# Patient Record
Sex: Female | Born: 1980 | Race: White | Hispanic: No | Marital: Married | Smoking: Never smoker
Health system: Southern US, Community
[De-identification: ages and names within clinical notes are randomized; demographics above are authoritative.]

## PROBLEM LIST (undated history)

## (undated) DIAGNOSIS — G43909 Migraine, unspecified, not intractable, without status migrainosus: Secondary | ICD-10-CM

## (undated) DIAGNOSIS — T7840XA Allergy, unspecified, initial encounter: Secondary | ICD-10-CM

## (undated) HISTORY — PX: OTHER SURGICAL HISTORY: SHX169

## (undated) HISTORY — PX: NASAL SINUS SURGERY: SHX719

## (undated) HISTORY — DX: Allergy, unspecified, initial encounter: T78.40XA

## (undated) HISTORY — DX: Migraine, unspecified, not intractable, without status migrainosus: G43.909

---

## 2018-04-22 ENCOUNTER — Other Ambulatory Visit: Payer: Self-pay

## 2018-04-22 ENCOUNTER — Encounter: Payer: Self-pay | Admitting: Family Medicine

## 2018-04-22 ENCOUNTER — Ambulatory Visit: Payer: Commercial Managed Care - PPO | Admitting: Family Medicine

## 2018-04-22 VITALS — BP 122/85 | HR 76 | Temp 98.1°F | Ht 62.0 in | Wt 167.3 lb

## 2018-04-22 DIAGNOSIS — J309 Allergic rhinitis, unspecified: Secondary | ICD-10-CM

## 2018-04-22 DIAGNOSIS — G43909 Migraine, unspecified, not intractable, without status migrainosus: Secondary | ICD-10-CM | POA: Diagnosis not present

## 2018-04-22 DIAGNOSIS — Z0000 Encounter for general adult medical examination without abnormal findings: Secondary | ICD-10-CM

## 2018-04-22 DIAGNOSIS — Z7689 Persons encountering health services in other specified circumstances: Secondary | ICD-10-CM | POA: Diagnosis not present

## 2018-04-22 DIAGNOSIS — Z23 Encounter for immunization: Secondary | ICD-10-CM

## 2018-04-22 LAB — MICROSCOPIC EXAMINATION
Bacteria, UA: NONE SEEN
WBC, UA: NONE SEEN /hpf (ref 0–5)

## 2018-04-22 LAB — UA/M W/RFLX CULTURE, ROUTINE
BILIRUBIN UA: NEGATIVE
Glucose, UA: NEGATIVE
KETONES UA: NEGATIVE
Leukocytes, UA: NEGATIVE
NITRITE UA: NEGATIVE
Protein, UA: NEGATIVE
Urobilinogen, Ur: 0.2 mg/dL (ref 0.2–1.0)
pH, UA: 5.5 (ref 5.0–7.5)

## 2018-04-22 MED ORDER — SUMATRIPTAN-NAPROXEN SODIUM 85-500 MG PO TABS: ORAL_TABLET | ORAL | 6 refills | Status: DC

## 2018-04-22 MED ORDER — CETIRIZINE HCL 10 MG PO TABS: 10.0000 mg | ORAL_TABLET | Freq: Every day | ORAL | 11 refills | Status: AC

## 2018-04-23 ENCOUNTER — Encounter: Payer: Self-pay | Admitting: Family Medicine

## 2018-04-23 LAB — COMPREHENSIVE METABOLIC PANEL
A/G RATIO: 2 (ref 1.2–2.2)
ALT: 12 IU/L (ref 0–32)
AST: 15 IU/L (ref 0–40)
Albumin: 4.2 g/dL (ref 3.5–5.5)
Alkaline Phosphatase: 57 IU/L (ref 39–117)
BUN/Creatinine Ratio: 26 — ABNORMAL HIGH (ref 9–23)
BUN: 16 mg/dL (ref 6–20)
Bilirubin Total: <0.2 mg/dL (ref 0.0–1.2)
CALCIUM: 9.8 mg/dL (ref 8.7–10.2)
CO2: 25 mmol/L (ref 20–29)
Chloride: 101 mmol/L (ref 96–106)
Creatinine, Ser: 0.61 mg/dL (ref 0.57–1.00)
GFR, EST AFRICAN AMERICAN: 132 mL/min/{1.73_m2} (ref >59)
GFR, EST NON AFRICAN AMERICAN: 115 mL/min/{1.73_m2} (ref >59)
Globulin, Total: 2.1 g/dL (ref 1.5–4.5)
Glucose: 81 mg/dL (ref 65–99)
POTASSIUM: 4.4 mmol/L (ref 3.5–5.2)
SODIUM: 141 mmol/L (ref 134–144)
TOTAL PROTEIN: 6.3 g/dL (ref 6.0–8.5)

## 2018-04-23 LAB — CBC WITH DIFFERENTIAL/PLATELET
BASOS: 0 %
Basophils Absolute: 0 10*3/uL (ref 0.0–0.2)
EOS (ABSOLUTE): 0.2 10*3/uL (ref 0.0–0.4)
EOS: 2 %
HEMATOCRIT: 40 % (ref 34.0–46.6)
Hemoglobin: 12.8 g/dL (ref 11.1–15.9)
IMMATURE GRANULOCYTES: 0 %
Immature Grans (Abs): 0 10*3/uL (ref 0.0–0.1)
Lymphocytes Absolute: 2.6 10*3/uL (ref 0.7–3.1)
Lymphs: 29 %
MCH: 29.1 pg (ref 26.6–33.0)
MCHC: 32 g/dL (ref 31.5–35.7)
MCV: 91 fL (ref 79–97)
MONOS ABS: 0.6 10*3/uL (ref 0.1–0.9)
Monocytes: 6 %
NEUTROS ABS: 5.6 10*3/uL (ref 1.4–7.0)
Neutrophils: 63 %
PLATELETS: 292 10*3/uL (ref 150–450)
RBC: 4.4 x10E6/uL (ref 3.77–5.28)
RDW: 12.3 % (ref 12.3–15.4)
WBC: 9 10*3/uL (ref 3.4–10.8)

## 2018-04-23 LAB — TSH: TSH: 1.36 u[IU]/mL (ref 0.450–4.500)

## 2018-04-23 LAB — LIPID PANEL W/O CHOL/HDL RATIO
CHOLESTEROL TOTAL: 184 mg/dL (ref 100–199)
HDL: 50 mg/dL (ref >39)
LDL CALC: 95 mg/dL (ref 0–99)
Triglycerides: 194 mg/dL — ABNORMAL HIGH (ref 0–149)
VLDL CHOLESTEROL CAL: 39 mg/dL (ref 5–40)

## 2018-04-27 ENCOUNTER — Other Ambulatory Visit (HOSPITAL_COMMUNITY)
Admission: RE | Admit: 2018-04-27 | Discharge: 2018-04-27 | Disposition: A | Discharge status: Home / Self Care | Payer: Commercial Managed Care - PPO | Source: Ambulatory Visit | Attending: Obstetrics & Gynecology | Admitting: Obstetrics & Gynecology

## 2018-04-27 ENCOUNTER — Encounter: Payer: Self-pay | Admitting: Obstetrics & Gynecology

## 2018-04-27 ENCOUNTER — Ambulatory Visit (INDEPENDENT_AMBULATORY_CARE_PROVIDER_SITE_OTHER): Payer: Commercial Managed Care - PPO | Admitting: Obstetrics & Gynecology

## 2018-04-27 VITALS — BP 120/80 | Ht 62.0 in | Wt 167.0 lb

## 2018-04-27 DIAGNOSIS — N946 Dysmenorrhea, unspecified: Secondary | ICD-10-CM | POA: Diagnosis not present

## 2018-04-27 DIAGNOSIS — N920 Excessive and frequent menstruation with regular cycle: Secondary | ICD-10-CM | POA: Diagnosis not present

## 2018-04-27 DIAGNOSIS — Z124 Encounter for screening for malignant neoplasm of cervix: Secondary | ICD-10-CM | POA: Insufficient documentation

## 2018-04-27 DIAGNOSIS — Z0000 Encounter for general adult medical examination without abnormal findings: Secondary | ICD-10-CM

## 2018-04-27 DIAGNOSIS — Z01411 Encounter for gynecological examination (general) (routine) with abnormal findings: Secondary | ICD-10-CM | POA: Diagnosis not present

## 2018-04-27 NOTE — Patient Instructions (Signed)
Endometrial Ablation Endometrial ablation is a procedure that destroys the thin inner layer of the lining of the uterus (endometrium). This procedure may be done:  To stop heavy periods.  To stop bleeding that is causing anemia.  To control irregular bleeding.  To treat bleeding caused by small tumors (fibroids) in the endometrium.  This procedure is often an alternative to major surgery, such as removal of the uterus and cervix (hysterectomy). As a result of this procedure:  You may not be able to have children. However, if you are premenopausal (you have not gone through menopause): ? You may still have a small chance of getting pregnant. ? You will need to use a reliable method of birth control after the procedure to prevent pregnancy.  You may stop having a menstrual period, or you may have only a small amount of bleeding during your period. Menstruation may return several years after the procedure.  Tell a health care provider about:  Any allergies you have.  All medicines you are taking, including vitamins, herbs, eye drops, creams, and over-the-counter medicines.  Any problems you or family members have had with the use of anesthetic medicines.  Any blood disorders you have.  Any surgeries you have had.  Any medical conditions you have. What are the risks? Generally, this is a safe procedure. However, problems may occur, including:  A hole (perforation) in the uterus or bowel.  Infection of the uterus, bladder, or vagina.  Bleeding.  Damage to other structures or organs.  An air bubble in the lung (air embolus).  Problems with pregnancy after the procedure.  Failure of the procedure.  Decreased ability to diagnose cancer in the endometrium.  What happens before the procedure?  You will have tests of your endometrium to make sure there are no pre-cancerous cells or cancer cells present.  You may have an ultrasound of the uterus.  You may be given  medicines to thin the endometrium.  Ask your health care provider about: ? Changing or stopping your regular medicines. This is especially important if you take diabetes medicines or blood thinners. ? Taking medicines such as aspirin and ibuprofen. These medicines can thin your blood. Do not take these medicines before your procedure if your doctor tells you not to.  Plan to have someone take you home from the hospital or clinic. What happens during the procedure?  You will lie on an exam table with your feet and legs supported as in a pelvic exam.  To lower your risk of infection: ? Your health care team will wash or sanitize their hands and put on germ-free (sterile) gloves. ? Your genital area will be washed with soap.  An IV tube will be inserted into one of your veins.  You will be given a medicine to help you relax (sedative).  A surgical instrument with a light and camera (resectoscope) will be inserted into your vagina and moved into your uterus. This allows your surgeon to see inside your uterus.  Endometrial tissue will be removed using one of the following methods: ? Radiofrequency. This method uses a radiofrequency-alternating electric current to remove the endometrium. ? Cryotherapy. This method uses extreme cold to freeze the endometrium. ? Heated-free liquid. This method uses a heated saltwater (saline) solution to remove the endometrium. ? Microwave. This method uses high-energy microwaves to heat up the endometrium and remove it. ? Thermal balloon. This method involves inserting a catheter with a balloon tip into the uterus. The balloon tip is   filled with heated fluid to remove the endometrium. The procedure may vary among health care providers and hospitals. What happens after the procedure?  Your blood pressure, heart rate, breathing rate, and blood oxygen level will be monitored until the medicines you were given have worn off.  As tissue healing occurs, you may  notice vaginal bleeding for 4-6 weeks after the procedure. You may also experience: ? Cramps. ? Thin, watery vaginal discharge that is light pink or brown in color. ? A need to urinate more frequently than usual. ? Nausea.  Do not drive for 24 hours if you were given a sedative.  Do not have sex or insert anything into your vagina until your health care provider approves. Summary  Endometrial ablation is done to treat the many causes of heavy menstrual bleeding.  The procedure may be done only after medications have been tried to control the bleeding.  Plan to have someone take you home from the hospital or clinic. This information is not intended to replace advice given to you by your health care provider. Make sure you discuss any questions you have with your health care provider. Document Released: 05/16/2004 Document Revised: 07/24/2016 Document Reviewed: 07/24/2016 Elsevier Interactive Patient Education  2017 [employer]  

## 2018-04-27 NOTE — Progress Notes (Signed)
HPI:      [name] is a 37 y.o. G1P1001 who LMP was Patient's last menstrual period was 04/22/2018., she presents today for her annual examination. The patient has no complaints today. The patient is sexually active. Her last pap: was normal and last mammogram: patient has never had a mammogram. The patient does perform self breast exams.  There is no notable family history of breast or ovarian cancer in her family.  The patient has regular exercise: yes.  The patient denies current symptoms of depression.  Periods are every 25-26 days, heavy for 1-2 days, painful, and has PMS for a week prior to periods; pain and bleeding lead to dysfunction for those days affected.  GYN History: Contraception: vasectomy  PMHx: Past Medical History:  Diagnosis Date  . Allergy   . Migraine    Past Surgical History:  Procedure Laterality Date  . NASAL SINUS SURGERY    . tonsils surgery     Family History  Problem Relation Age of Onset  . Diabetes Mother   . Hypertension Mother   . Asthma Mother   . Cancer Mother   . Thyroid disease Mother   . Diabetes Paternal Grandfather    Social History   Tobacco Use  . Smoking status: Never Smoker  . Smokeless tobacco: Never Used  Substance Use Topics  . Alcohol use: Not Currently  . Drug use: Never    Current Outpatient Medications:  .  cetirizine (ZYRTEC) 10 MG tablet, Take 1 tablet (10 mg total) by mouth daily., Disp: 30 tablet, Rfl: 11 .  clindamycin (CLEOCIN T) 1 % lotion, APPLY TO FACE DAILY IN THE MORNING, Disp: , Rfl: 2 .  minocycline (MINOCIN,DYNACIN) 100 MG capsule, Take by mouth., Disp: , Rfl:  .  naproxen sodium (ALEVE) 220 MG tablet, Take 220 mg by mouth as needed., Disp: , Rfl:  .  SUMAtriptan-naproxen (TREXIMET) 85-500 MG tablet, Max dose 2 tabs per day, Disp: 9 tablet, Rfl: 6 Allergies: Prochlorperazine edisylate and Topiramate  Review of Systems  Constitutional: Negative for chills, fever and malaise/fatigue.  HENT:  Negative for congestion, sinus pain and sore throat.   Eyes: Negative for blurred vision and pain.  Respiratory: Negative for cough and wheezing.   Cardiovascular: Negative for chest pain and leg swelling.  Gastrointestinal: Negative for abdominal pain, constipation, diarrhea, heartburn, nausea and vomiting.  Genitourinary: Negative for dysuria, frequency, hematuria and urgency.  Musculoskeletal: Negative for back pain, joint pain, myalgias and neck pain.  Skin: Negative for itching and rash.  Neurological: Negative for dizziness, tremors and weakness.  Endo/Heme/Allergies: Does not bruise/bleed easily.  Psychiatric/Behavioral: Negative for depression. The patient is not nervous/anxious and does not have insomnia.    Objective: BP 120/80   Ht 5\' 2"  (1.575 m)   Wt 167 lb (75.8 kg)   LMP 04/22/2018   BMI 30.54 kg/m   Filed Weights   04/27/18 1449  Weight: 167 lb (75.8 kg)   Body mass index is 30.54 kg/m. Physical Exam  Constitutional: She is oriented to person, place, and time. She appears well-developed and well-nourished. No distress.  Genitourinary: Rectum normal, vagina normal and uterus normal. Pelvic exam was performed with patient supine. There is no rash or lesion on the right labia. There is no rash or lesion on the left labia. Vagina exhibits no lesion. No bleeding in the vagina. Right adnexum does not display mass and does not display tenderness. Left adnexum does not display mass and does not display  tenderness. Cervix does not exhibit motion tenderness, lesion, friability or polyp.   Uterus is mobile and midaxial. Uterus is not enlarged or exhibiting a mass.  HENT:  Head: Normocephalic and atraumatic. Head is without laceration.  Right Ear: Hearing normal.  Left Ear: Hearing normal.  Nose: No epistaxis.  No foreign bodies.  Mouth/Throat: Uvula is midline, oropharynx is clear and moist and mucous membranes are normal.  Eyes: Pupils are equal, round, and reactive to light.   Neck: Normal range of motion. Neck supple. No thyromegaly present.  Cardiovascular: Normal rate and regular rhythm. Exam reveals no gallop and no friction rub.  No murmur heard. Pulmonary/Chest: Effort normal and breath sounds normal. No respiratory distress. She has no wheezes. Right breast exhibits no mass, no skin change and no tenderness. Left breast exhibits no mass, no skin change and no tenderness.  Abdominal: Soft. Bowel sounds are normal. She exhibits no distension. There is no tenderness. There is no rebound.  Musculoskeletal: Normal range of motion.  Neurological: She is alert and oriented to person, place, and time. No cranial nerve deficit.  Skin: Skin is warm and dry.  Psychiatric: She has a normal mood and affect. Judgment normal.  Vitals reviewed.  Assessment:  ANNUAL EXAM 1. Annual physical exam   2. Screening for cervical cancer   3. Dysmenorrhea   4. Menorrhagia with regular cycle    Screening Plan:            1.  Cervical Screening-  Pap smear done today  2. Breast screening- Exam annually and mammogram>40 planned   3. Labs managed by PCP  4. Counseling for contraception: vasectomy   5. Dysmenorrhea and heavy bleeding for 1-2 days per cycle that lays her up in bed and interferes w work/daily activities.  Considering options of ablation.    F/U  Return in about 1 year (around 04/28/2019) for Annual.  Sabrina Major, MD, [facility], [facility] 04/27/2018  3:08 PM

## 2018-04-29 LAB — CYTOLOGY - PAP: Diagnosis: NEGATIVE

## 2018-05-16 DIAGNOSIS — J309 Allergic rhinitis, unspecified: Secondary | ICD-10-CM | POA: Insufficient documentation

## 2018-05-16 DIAGNOSIS — G43909 Migraine, unspecified, not intractable, without status migrainosus: Secondary | ICD-10-CM | POA: Insufficient documentation

## 2018-05-16 NOTE — Patient Instructions (Signed)
Follow up in 1 year.

## 2018-05-16 NOTE — Assessment & Plan Note (Signed)
Stable on zyrtec, recommended flonase if becoming symptomatic

## 2018-05-16 NOTE — Assessment & Plan Note (Signed)
Well controlled with treximet, continue current regimen

## 2018-05-16 NOTE — Progress Notes (Signed)
BP 122/85   Pulse 76   Temp 98.1 F (36.7 C) (Oral)   Ht 5\' 2"  (1.575 m)   Wt 167 lb 4.8 oz (75.9 kg)   LMP 04/22/2018   BMI 30.60 kg/m    Subjective:    Patient ID: Sabrina Kennedy, female    DOB: [birth year], 37 y.o.   MRN: [id number]  HPI: Sabrina Kennedy is a 37 y.o. female presenting on 04/22/2018 for comprehensive medical examination. Current medical complaints include:see below  Here today to establish care.   Hx of migraines. Averages 2-3 migraines monthly, good relief with imitrex and NSAIDs. Denies N/V, blurry vision, confusion, syncope.   Has appt for a pap smear next week with GYN.   Knows she's had a tdap within the last 2 years just does not remember exact date.   Chronic neck and low back pain, managed by a chiropractor. Takes naproxen prn.   She currently lives with: Menopausal Symptoms: no  Depression Screen done today and results listed below:  Depression screen [facility] 2/9 04/22/2018  Decreased Interest 0  Down, Depressed, Hopeless 0  PHQ - 2 Score 0  Altered sleeping 0  Tired, decreased energy 0  Change in appetite 0  Feeling bad or failure about yourself  0  Trouble concentrating 0  Moving slowly or fidgety/restless 0  PHQ-9 Score 0    The patient does not have a history of falls. I did not complete a risk assessment for falls. A plan of care for falls was not documented.   Past Medical History:  Past Medical History:  Diagnosis Date  . Allergy   . Migraine     Surgical History:  Past Surgical History:  Procedure Laterality Date  . NASAL SINUS SURGERY    . tonsils surgery      Medications:  Current Outpatient Medications on File Prior to Visit  Medication Sig  . clindamycin (CLEOCIN T) 1 % lotion APPLY TO FACE DAILY IN THE MORNING  . minocycline (MINOCIN,DYNACIN) 100 MG capsule Take by mouth.  . naproxen sodium (ALEVE) 220 MG tablet Take 220 mg by mouth as needed.   No current facility-administered medications on file prior to visit.      Allergies:  Allergies  Allergen Reactions  . Prochlorperazine Edisylate   . Topiramate Other (See Comments)    Severe abdominal pain, rash on trunk, 1 day had stool incontinance    Social History:  Social History   Socioeconomic History  . Marital status: Married    Spouse name: Not on file  . Number of children: Not on file  . Years of education: Not on file  . Highest education level: Not on file  Occupational History  . Not on file  Social Needs  . Financial resource strain: Not on file  . Food insecurity:    Worry: Not on file    Inability: Not on file  . Transportation needs:    Medical: Not on file    Non-medical: Not on file  Tobacco Use  . Smoking status: Never Smoker  . Smokeless tobacco: Never Used  Substance and Sexual Activity  . Alcohol use: Not Currently  . Drug use: Never  . Sexual activity: Yes  Lifestyle  . Physical activity:    Days per week: Not on file    Minutes per session: Not on file  . Stress: Not on file  Relationships  . Social connections:    Talks on phone: Not on file    Gets  together: Not on file    Attends religious service: Not on file    Active member of club or organization: Not on file    Attends meetings of clubs or organizations: Not on file    Relationship status: Not on file  . Intimate partner violence:    Fear of current or ex partner: Not on file    Emotionally abused: Not on file    Physically abused: Not on file    Forced sexual activity: Not on file  Other Topics Concern  . Not on file  Social History Narrative  . Not on file   Social History   Tobacco Use  Smoking Status Never Smoker  Smokeless Tobacco Never Used   Social History   Substance and Sexual Activity  Alcohol Use Not Currently    Family History:  Family History  Problem Relation Age of Onset  . Diabetes Mother   . Hypertension Mother   . Asthma Mother   . Cancer Mother   . Thyroid disease Mother   . Diabetes Paternal  Grandfather     Past medical history, surgical history, medications, allergies, family history and social history reviewed with patient today and changes made to appropriate areas of the chart.   Review of Systems - General ROS: negative Psychological ROS: negative Ophthalmic ROS: negative ENT ROS: negative Allergy and Immunology ROS: negative Hematological and Lymphatic ROS: negative Endocrine ROS: negative Breast ROS: negative for breast lumps Respiratory ROS: no cough, shortness of breath, or wheezing Cardiovascular ROS: no chest pain or dyspnea on exertion Gastrointestinal ROS: no abdominal pain, change in bowel habits, or black or bloody stools Genito-Urinary ROS: no dysuria, trouble voiding, or hematuria Musculoskeletal ROS: negative Neurological ROS: no TIA or stroke symptoms Dermatological ROS: negative All other ROS negative except what is listed above and in the HPI.      Objective:    BP 122/85   Pulse 76   Temp 98.1 F (36.7 C) (Oral)   Ht 5\' 2"  (1.575 m)   Wt 167 lb 4.8 oz (75.9 kg)   LMP 04/22/2018   BMI 30.60 kg/m   Wt Readings from Last 3 Encounters:  04/27/18 167 lb (75.8 kg)  04/22/18 167 lb 4.8 oz (75.9 kg)    Physical Exam  Constitutional: She is oriented to person, place, and time. She appears well-developed and well-nourished. No distress.  HENT:  Head: Atraumatic.  Right Ear: External ear normal.  Left Ear: External ear normal.  Nose: Nose normal.  Mouth/Throat: Oropharynx is clear and moist. No oropharyngeal exudate.  Eyes: Pupils are equal, round, and reactive to light. Conjunctivae are normal. No scleral icterus.  Neck: Normal range of motion. Neck supple. No thyromegaly present.  Cardiovascular: Normal rate, regular rhythm, normal heart sounds and intact distal pulses.  Pulmonary/Chest: Effort normal and breath sounds normal. No respiratory distress.  Breast exam done through GYN  Abdominal: Soft. Bowel sounds are normal. She exhibits no  mass. There is no tenderness.  Genitourinary:  Genitourinary Comments: Pelvic exam done through GYN  Musculoskeletal: Normal range of motion. She exhibits no edema or tenderness.  Lymphadenopathy:    She has no cervical adenopathy.  Neurological: She is alert and oriented to person, place, and time. No cranial nerve deficit.  Skin: Skin is warm and dry. No rash noted.  Psychiatric: She has a normal mood and affect. Her behavior is normal.  Nursing note and vitals reviewed.   Results for orders placed or performed in visit on  04/22/18  Microscopic Examination  Result Value Ref Range   WBC, UA None seen 0 - 5 /hpf   RBC, UA 0-2 0 - 2 /hpf   Epithelial Cells (non renal) 0-10 0 - 10 /hpf   Bacteria, UA None seen None seen/Few  CBC with Differential/Platelet  Result Value Ref Range   WBC 9.0 3.4 - 10.8 x10E3/uL   RBC 4.40 3.77 - 5.28 x10E6/uL   Hemoglobin 12.8 11.1 - 15.9 g/dL   Hematocrit 82.9 56.2 - 46.6 %   MCV 91 79 - 97 fL   MCH 29.1 26.6 - 33.0 pg   MCHC 32.0 31.5 - 35.7 g/dL   RDW 13.0 86.5 - 78.4 %   Platelets 292 150 - 450 x10E3/uL   Neutrophils 63 Not Estab. %   Lymphs 29 Not Estab. %   Monocytes 6 Not Estab. %   Eos 2 Not Estab. %   Basos 0 Not Estab. %   Neutrophils Absolute 5.6 1.4 - 7.0 x10E3/uL   Lymphocytes Absolute 2.6 0.7 - 3.1 x10E3/uL   Monocytes Absolute 0.6 0.1 - 0.9 x10E3/uL   EOS (ABSOLUTE) 0.2 0.0 - 0.4 x10E3/uL   Basophils Absolute 0.0 0.0 - 0.2 x10E3/uL   Immature Granulocytes 0 Not Estab. %   Immature Grans (Abs) 0.0 0.0 - 0.1 x10E3/uL  Comprehensive metabolic panel  Result Value Ref Range   Glucose 81 65 - 99 mg/dL   BUN 16 6 - 20 mg/dL   Creatinine, Ser 6.96 0.57 - 1.00 mg/dL   GFR calc non Af Amer 115 >59 mL/min/1.73   GFR calc Af Amer 132 >59 mL/min/1.73   BUN/Creatinine Ratio 26 (H) 9 - 23   Sodium 141 134 - 144 mmol/L   Potassium 4.4 3.5 - 5.2 mmol/L   Chloride 101 96 - 106 mmol/L   CO2 25 20 - 29 mmol/L   Calcium 9.8 8.7 - 10.2 mg/dL    Total Protein 6.3 6.0 - 8.5 g/dL   Albumin 4.2 3.5 - 5.5 g/dL   Globulin, Total 2.1 1.5 - 4.5 g/dL   Albumin/Globulin Ratio 2.0 1.2 - 2.2   Bilirubin Total <0.2 0.0 - 1.2 mg/dL   Alkaline Phosphatase 57 39 - 117 IU/L   AST 15 0 - 40 IU/L   ALT 12 0 - 32 IU/L  Lipid Panel w/o Chol/HDL Ratio  Result Value Ref Range   Cholesterol, Total 184 100 - 199 mg/dL   Triglycerides 295 (H) 0 - 149 mg/dL   HDL 50 >28 mg/dL   VLDL Cholesterol Cal 39 5 - 40 mg/dL   LDL Calculated 95 0 - 99 mg/dL  TSH  Result Value Ref Range   TSH 1.360 0.450 - 4.500 uIU/mL  UA/M w/rflx Culture, Routine  Result Value Ref Range   Specific Gravity, UA <1.005 (L) 1.005 - 1.030   pH, UA 5.5 5.0 - 7.5   Color, UA Yellow Yellow   Appearance Ur Clear Clear   Leukocytes, UA Negative Negative   Protein, UA Negative Negative/Trace   Glucose, UA Negative Negative   Ketones, UA Negative Negative   RBC, UA 1+ (A) Negative   Bilirubin, UA Negative Negative   Urobilinogen, Ur 0.2 0.2 - 1.0 mg/dL   Nitrite, UA Negative Negative   Microscopic Examination See below:       Assessment & Plan:   Problem List Items Addressed This Visit      Cardiovascular and Mediastinum   Migraine    Well controlled with treximet, continue  current regimen      Relevant Medications   naproxen sodium (ALEVE) 220 MG tablet   SUMAtriptan-naproxen (TREXIMET) 85-500 MG tablet     Respiratory   Allergic rhinitis - Primary    Stable on zyrtec, recommended flonase if becoming symptomatic       Other Visit Diagnoses    Encounter to establish care       Annual physical exam       Relevant Orders   CBC with Differential/Platelet (Completed)   Comprehensive metabolic panel (Completed)   Lipid Panel w/o Chol/HDL Ratio (Completed)   TSH (Completed)   UA/M w/rflx Culture, Routine (Completed)   Flu vaccine need       Relevant Orders   Flu Vaccine QUAD 36+ mos IM (Completed)       Follow up plan: Return in about 1 year (around  04/23/2019) for CPE.   LABORATORY TESTING:  - Pap smear: done elsewhere  IMMUNIZATIONS:   - Tdap: Tetanus vaccination status reviewed: last tetanus booster within 10 years. - Influenza: Administered today  PATIENT COUNSELING:   Advised to take 1 mg of folate supplement per day if capable of pregnancy.   Sexuality: Discussed sexually transmitted diseases, partner selection, use of condoms, avoidance of unintended pregnancy  and contraceptive alternatives.   Advised to avoid cigarette smoking.  I discussed with the patient that most people either abstain from alcohol or drink within safe limits (<=14/week and <=4 drinks/occasion for males, <=7/weeks and <= 3 drinks/occasion for females) and that the risk for alcohol disorders and other health effects rises proportionally with the number of drinks per week and how often a drinker exceeds daily limits.  Discussed cessation/primary prevention of drug use and availability of treatment for abuse.   Diet: Encouraged to adjust caloric intake to maintain  or achieve ideal body weight, to reduce intake of dietary saturated fat and total fat, to limit sodium intake by avoiding high sodium foods and not adding table salt, and to maintain adequate dietary potassium and calcium preferably from fresh fruits, vegetables, and low-fat dairy products.    stressed the importance of regular exercise  Injury prevention: Discussed safety belts, safety helmets, smoke detector, smoking near bedding or upholstery.   Dental health: Discussed importance of regular tooth brushing, flossing, and dental visits.    NEXT PREVENTATIVE PHYSICAL DUE IN 1 YEAR. Return in about 1 year (around 04/23/2019) for CPE.

## 2018-08-13 ENCOUNTER — Telehealth: Payer: Self-pay

## 2018-08-13 DIAGNOSIS — N920 Excessive and frequent menstruation with regular cycle: Secondary | ICD-10-CM

## 2018-08-13 NOTE — Telephone Encounter (Signed)
Pt wishes to move forward c ablation. Knows for sure she wants it done in March. Does she needs to come in for consultation or something and if so, when?  [phone]

## 2018-08-16 NOTE — Telephone Encounter (Signed)
Can you put the order in for the u/s

## 2018-08-16 NOTE — Telephone Encounter (Signed)
Yes, schedule appt for uterine assessment prior to ablation (ultrasound, biopsy (this is minor)).  [facility] [place] and appt w me (PH) therafter.  Then we can schedule ablation after period in March.

## 2018-08-17 NOTE — Telephone Encounter (Signed)
Patient is schedule 08/24/18 with RPH °

## 2018-08-24 ENCOUNTER — Encounter: Payer: Self-pay | Admitting: Obstetrics & Gynecology

## 2018-08-24 ENCOUNTER — Other Ambulatory Visit (HOSPITAL_COMMUNITY)
Admission: RE | Admit: 2018-08-24 | Discharge: 2018-08-24 | Disposition: A | Discharge status: Home / Self Care | Payer: Commercial Managed Care - PPO | Source: Ambulatory Visit | Attending: Obstetrics & Gynecology | Admitting: Obstetrics & Gynecology

## 2018-08-24 ENCOUNTER — Ambulatory Visit (INDEPENDENT_AMBULATORY_CARE_PROVIDER_SITE_OTHER): Payer: Commercial Managed Care - PPO | Admitting: Obstetrics & Gynecology

## 2018-08-24 ENCOUNTER — Ambulatory Visit (INDEPENDENT_AMBULATORY_CARE_PROVIDER_SITE_OTHER): Payer: Commercial Managed Care - PPO

## 2018-08-24 VITALS — BP 140/90 | Ht 62.0 in | Wt 164.0 lb

## 2018-08-24 DIAGNOSIS — N920 Excessive and frequent menstruation with regular cycle: Secondary | ICD-10-CM | POA: Diagnosis not present

## 2018-08-24 DIAGNOSIS — N946 Dysmenorrhea, unspecified: Secondary | ICD-10-CM | POA: Diagnosis not present

## 2018-08-24 NOTE — Patient Instructions (Signed)
Endometrial Ablation Endometrial ablation is a procedure that destroys the thin inner layer of the lining of the uterus (endometrium). This procedure may be done:  To stop heavy periods.  To stop bleeding that is causing anemia.  To control irregular bleeding.  To treat bleeding caused by small tumors (fibroids) in the endometrium. This procedure is often an alternative to major surgery, such as removal of the uterus and cervix (hysterectomy). As a result of this procedure:  You may not be able to have children. However, if you are premenopausal (you have not gone through menopause): ? You may still have a small chance of getting pregnant. ? You will need to use a reliable method of birth control after the procedure to prevent pregnancy.  You may stop having a menstrual period, or you may have only a small amount of bleeding during your period. Menstruation may return several years after the procedure. Tell a health care provider about:  Any allergies you have.  All medicines you are taking, including vitamins, herbs, eye drops, creams, and over-the-counter medicines.  Any problems you or family members have had with the use of anesthetic medicines.  Any blood disorders you have.  Any surgeries you have had.  Any medical conditions you have. What are the risks? Generally, this is a safe procedure. However, problems may occur, including:  A hole (perforation) in the uterus or bowel.  Infection of the uterus, bladder, or vagina.  Bleeding.  Damage to other structures or organs.  An air bubble in the lung (air embolus).  Problems with pregnancy after the procedure.  Failure of the procedure.  Decreased ability to diagnose cancer in the endometrium. What happens before the procedure?  You will have tests of your endometrium to make sure there are no pre-cancerous cells or cancer cells present.  You may have an ultrasound of the uterus.  You may be given medicines to  thin the endometrium.  Ask your health care provider about: ? Changing or stopping your regular medicines. This is especially important if you take diabetes medicines or blood thinners. ? Taking medicines such as aspirin and ibuprofen. These medicines can thin your blood. Do not take these medicines before your procedure if your doctor tells you not to.  Plan to have someone take you home from the hospital or clinic. What happens during the procedure?   You will lie on an exam table with your feet and legs supported as in a pelvic exam.  To lower your risk of infection: ? Your health care team will wash or sanitize their hands and put on germ-free (sterile) gloves. ? Your genital area will be washed with soap.  An IV tube will be inserted into one of your veins.  You will be given a medicine to help you relax (sedative).  A surgical instrument with a light and camera (resectoscope) will be inserted into your vagina and moved into your uterus. This allows your surgeon to see inside your uterus.  Endometrial tissue will be removed using one of the following methods: ? Radiofrequency. This method uses a radiofrequency-alternating electric current to remove the endometrium. ? Cryotherapy. This method uses extreme cold to freeze the endometrium. ? Heated-free liquid. This method uses a heated saltwater (saline) solution to remove the endometrium. ? Microwave. This method uses high-energy microwaves to heat up the endometrium and remove it. ? Thermal balloon. This method involves inserting a catheter with a balloon tip into the uterus. The balloon tip is filled with   heated fluid to remove the endometrium. The procedure may vary among health care providers and hospitals. What happens after the procedure?  Your blood pressure, heart rate, breathing rate, and blood oxygen level will be monitored until the medicines you were given have worn off.  As tissue healing occurs, you may notice  vaginal bleeding for 4-6 weeks after the procedure. You may also experience: ? Cramps. ? Thin, watery vaginal discharge that is light pink or brown in color. ? A need to urinate more frequently than usual. ? Nausea.  Do not drive for 24 hours if you were given a sedative.  Do not have sex or insert anything into your vagina until your health care provider approves. Summary  Endometrial ablation is done to treat the many causes of heavy menstrual bleeding.  The procedure may be done only after medications have been tried to control the bleeding.  Plan to have someone take you home from the hospital or clinic. This information is not intended to replace advice given to you by your health care provider. Make sure you discuss any questions you have with your health care provider. Document Released: 05/16/2004 Document Revised: 12/22/2017 Document Reviewed: 07/24/2016 Elsevier Interactive Patient Education  2019 Elsevier Inc.   Endometrial Biopsy, Care After This sheet gives you information about how to care for yourself after your procedure. Your health care provider may also give you more specific instructions. If you have problems or questions, contact your health care provider. What can I expect after the procedure? After the procedure, it is common to have:  Mild cramping.  A small amount of vaginal bleeding for a few days. This is normal. Follow these instructions at home:   Take over-the-counter and prescription medicines only as told by your health care provider.  Do not douche, use tampons, or have sexual intercourse until your health care provider approves.  Return to your normal activities as told by your health care provider. Ask your health care provider what activities are safe for you.  Follow instructions from your health care provider about any activity restrictions, such as restrictions on strenuous exercise or heavy lifting. Contact a health care provider  if:  You have heavy bleeding, or bleed for longer than 2 days after the procedure.  You have bad smelling discharge from your vagina.  You have a fever or chills.  You have a burning sensation when urinating or you have difficulty urinating.  You have severe pain in your lower abdomen. Get help right away if:  You have severe cramps in your stomach or back.  You pass large blood clots.  Your bleeding increases.  You become weak or light-headed, or you pass out. Summary  After the procedure, it is common to have mild cramping and a small amount of vaginal bleeding for a few days.  Do not douche, use tampons, or have sexual intercourse until your health care provider approves.  Return to your normal activities as told by your health care provider. Ask your health care provider what activities are safe for you. This information is not intended to replace advice given to you by your health care provider. Make sure you discuss any questions you have with your health care provider. Document Released: 04/27/2013 Document Revised: 07/23/2016 Document Reviewed: 07/23/2016 Elsevier Interactive Patient Education  2019 [employer].

## 2018-08-24 NOTE — Progress Notes (Signed)
  HPI: Dysfunctional Uterine Bleeding Patient complains of irregular menses. She had been bleeding regularly. She is now bleeding every 22-25 days and menses are lasting 5-7 days. She changes her pad or tampon every 1 hours. Clots are medium in size. Dysmenorrhea:moderate, occurring throughout menses. Cyclic symptoms include: bloating, breast tenderness, changes in libido, depression and headache. Current contraception: vasectomy. History of infertility: no. History of abnormal Pap smear: no.   Ultrasound demonstrates no masses seen These findings are normal  PMHx: She  has a past medical history of Allergy and Migraine. Also,  has a past surgical history that includes tonsils surgery and Nasal sinus surgery., family history includes Asthma in her mother; Cancer in her mother; Diabetes in her mother and paternal grandfather; Hypertension in her mother; Thyroid disease in her mother.,  reports that she has never smoked. She has never used smokeless tobacco. She reports previous alcohol use. She reports that she does not use drugs.  She has a current medication list which includes the following prescription(s): cetirizine, clindamycin, minocycline, naproxen sodium, and sumatriptan-naproxen. Also, is allergic to prochlorperazine edisylate and topiramate.  Review of Systems  All other systems reviewed and are negative.  Objective: BP 140/90   Ht 5\' 2"  (1.575 m)   Wt 164 lb (74.4 kg)   LMP 07/31/2018   BMI 30.00 kg/m   Physical examination Constitutional NAD, Conversant  Skin No rashes, lesions or ulceration.   Extremities: Moves all appropriately.  Normal ROM for age. No lymphadenopathy.  Neuro: Grossly intact  Psych: Oriented to PPT.  Normal mood. Normal affect.   Assessment:  Menorrhagia with regular cycle Dysmenorrhea Patient has abnormal uterine bleeding . She has a normal exam today, with no evidence of lesions.  Evaluation includes the following: exam, labs such as hormonal  testing, and pelvic ultrasound to evaluate for any structural gynecologic abnormalities.  Patient to follow up after testing.  Treatment option for menorrhagia or menometrorrhagia discussed in great detail with the patient.  Options include hormonal therapy, IUD therapy such as Mirena, D&C, Ablation, and Hysterectomy.  The pros and cons of each option discussed with patient.  Desires ablation. Plan for Oct 01, 2018 Info provided  Endometrial Biopsy After discussion with the patient regarding her abnormal uterine bleeding I recommended that she proceed with an endometrial biopsy for further diagnosis. The risks, benefits, alternatives, and indications for an endometrial biopsy were discussed with the patient in detail. She understood the risks including infection, bleeding, cervical laceration and uterine perforation.  Verbal consent was obtained.   PROCEDURE NOTE:  Pipelle endometrial biopsy was performed using aseptic technique with iodine preparation.  The uterus was sounded to a length of 7 cm.  Adequate sampling was obtained with minimal blood loss.  The patient tolerated the procedure well.  Disposition will be pending pathology.  Annamarie Major, MD, [facility], [facility] 08/24/2018  3:36 PM

## 2018-08-25 ENCOUNTER — Telehealth: Payer: Self-pay | Admitting: Obstetrics & Gynecology

## 2018-08-25 NOTE — Telephone Encounter (Signed)
Per [facility], $40 copay only, will not apply deductible and coinsurance, no auth required. Ref# [id number].

## 2018-08-25 NOTE — Telephone Encounter (Signed)
Patient is aware of H&P for in-office procedure on Monday, 09/27/18 @ 4:10p.m, and HYSTEROSCOPY WITH ABLATION on Friday, 10/01/18 @ 1:30p.m. w/ Dr. Tiburcio Pea. Patient confirmed UMR and no secondary insurance. UMR benefits discussed. Patient said she called for benefits and was told she would only owe a copay for the procedure.

## 2018-08-25 NOTE — Telephone Encounter (Signed)
-----   Message from Nadara Mustard, MD sent at 08/24/2018  3:48 PM EST ----- Regarding: OFFICE ABLATION Surgery Booking Request Patient Full Name:  Sabrina Kennedy  MRN: [id number]  DOB: Dec 19, 1980  Surgeon: Letitia Libra, MD  Requested Surgery Date and Time: 10/01/2018 in the afternoon Primary Diagnosis AND Code: Menorrhagia and dysmenorrhea Secondary Diagnosis and Code:  Surgical Procedure: Hysteroscopy with Ablation (IN OFFICE) L&D Notification: No Admission Status: IN OFFICE Length of Surgery: 30 min Special Case Needs: Minerva H&P: yes (date)

## 2018-08-27 NOTE — Progress Notes (Signed)
Pt aware.

## 2018-08-27 NOTE — Progress Notes (Signed)
Let her know biopsy as expected was normal

## 2018-09-27 ENCOUNTER — Encounter: Payer: Self-pay | Admitting: Obstetrics & Gynecology

## 2018-09-27 ENCOUNTER — Ambulatory Visit (INDEPENDENT_AMBULATORY_CARE_PROVIDER_SITE_OTHER): Payer: Commercial Managed Care - PPO | Admitting: Obstetrics & Gynecology

## 2018-09-27 VITALS — BP 108/80 | Ht 62.0 in | Wt 162.0 lb

## 2018-09-27 DIAGNOSIS — N920 Excessive and frequent menstruation with regular cycle: Secondary | ICD-10-CM

## 2018-09-27 DIAGNOSIS — N946 Dysmenorrhea, unspecified: Secondary | ICD-10-CM

## 2018-09-27 NOTE — Progress Notes (Signed)
   PRE-PROCEDURE HISTORY AND PHYSICAL EXAM  HPI:  Sabrina Kennedy is a 38 y.o. G1P1001 Patient's last menstrual period was 09/20/2018.; she is being admitted for procedure related to abnormal uterine bleeding.  Heavy more frequent and irreg periods w associated sx's.  Normal [place] and EMB and PAP.  PMHx: Past Medical History:  Diagnosis Date  . Allergy   . Migraine    Past Surgical History:  Procedure Laterality Date  . NASAL SINUS SURGERY    . tonsils surgery     Family History  Problem Relation Age of Onset  . Diabetes Mother   . Hypertension Mother   . Asthma Mother   . Cancer Mother   . Thyroid disease Mother   . Diabetes Paternal Grandfather    Social History   Tobacco Use  . Smoking status: Never Smoker  . Smokeless tobacco: Never Used  Substance Use Topics  . Alcohol use: Not Currently  . Drug use: Never    Current Outpatient Medications:  .  cetirizine (ZYRTEC) 10 MG tablet, Take 1 tablet (10 mg total) by mouth daily., Disp: 30 tablet, Rfl: 11 .  naproxen sodium (ALEVE) 220 MG tablet, Take 220 mg by mouth as needed., Disp: , Rfl:  .  SUMAtriptan-naproxen (TREXIMET) 85-500 MG tablet, Max dose 2 tabs per day, Disp: 9 tablet, Rfl: 6 .  clindamycin (CLEOCIN T) 1 % lotion, APPLY TO FACE DAILY IN THE MORNING, Disp: , Rfl: 2 .  minocycline (MINOCIN,DYNACIN) 100 MG capsule, Take by mouth., Disp: , Rfl:  Allergies: Prochlorperazine edisylate and Topiramate  Review of Systems  All other systems reviewed and are negative.  Objective: BP 108/80   Ht 5\' 2"  (1.575 m)   Wt 162 lb (73.5 kg)   LMP 09/20/2018   BMI 29.63 kg/m   Filed Weights   09/27/18 1615  Weight: 162 lb (73.5 kg)   Physical Exam Constitutional:      General: She is not in acute distress.    Appearance: She is well-developed.  Musculoskeletal: Normal range of motion.  Neurological:     Mental Status: She is alert and oriented to person, place, and time.  Skin:    General: Skin is warm and dry.    Vitals signs reviewed.   Assessment: 1. Menorrhagia with regular cycle   2. Dysmenorrhea   Option for ablation discussed; alternatives counseled  Patient was told that it is normal to have menstrual bleeding after an endometrial ablation, only about 80% of patients become amenorrheic, 10% of patients have normal or light periods, and 10% of patients have no change in their bleeding pattern and may need further intervention.  She was told she will observe her periods for a few months after her ablation to see what her periods will be like; it is recommended to wait until at least three months after the procedure before making conclusions about how periods are going to be like after an ablation.  Medications discussed for before and after the procedure. Rx given.  Annamarie Major, MD, [facility], [facility] 09/27/2018  4:32 PM

## 2018-09-27 NOTE — Patient Instructions (Signed)
  Endometrial Ablation Pre-Procedural Instructions for Patient   You may have a light meal prior to coming to the office if desired.    You can plan on your appointment taking about 1 hour.  The actual procedure lasts for 1/2 hour but you will need to remain in the office for a short period after the procedure.    You may feel drowsy from the medication administered prior to and during the procedure. You should arrange for transportation to and from the office.    The following medications have been prescribed to you.  Please follow the doctor's instructions in taking these medications:  Day before Procedure    Cytotec 200 mg vaginally at bedtime    Ibuprofen 800 mg one by mouth three times a day for 2 days before procedure    Day of Procedure  Phenergan 25 mg  by mouth 1 hour before procedure Valium 5mg   1 by mouth 1 hour before procedure Norco 5/325 mg 1 by mouth 1 hour before procedure    then1 to 2 by mouth every 4-5 hours as needed for pain Ibuprofen 600 mg 1 by mouth every 8 hours as needed for pain

## 2018-10-01 ENCOUNTER — Encounter: Payer: Self-pay | Admitting: Obstetrics & Gynecology

## 2018-10-01 ENCOUNTER — Other Ambulatory Visit: Payer: Self-pay

## 2018-10-01 ENCOUNTER — Ambulatory Visit (INDEPENDENT_AMBULATORY_CARE_PROVIDER_SITE_OTHER): Payer: Commercial Managed Care - PPO | Admitting: Obstetrics & Gynecology

## 2018-10-01 VITALS — BP 120/80 | Ht 62.0 in | Wt 166.0 lb

## 2018-10-01 DIAGNOSIS — N920 Excessive and frequent menstruation with regular cycle: Secondary | ICD-10-CM | POA: Diagnosis not present

## 2018-10-01 DIAGNOSIS — N946 Dysmenorrhea, unspecified: Secondary | ICD-10-CM | POA: Diagnosis not present

## 2018-10-01 NOTE — Progress Notes (Signed)
Hysteroscopy with Minerva Endometrial Ablation  Operative Report   Place of Surgery: [facility] Pre-Operative Diagnosis: Menorrhagia Procedure: Hysteroscopy with Minerva Endometrial Ablation  Surgeon: Annamarie Major, MD  Anesthesia: Paracervical Block   Operative Technique:  The patient was prepped and draped in the usual manner for Hysteroscopy with Minerva. Patient had voided prior to coming to the OR. A short weighted speculum was placed into the vagina and the anterior lip of the cervix was grasped with a single tooth tenaculum. Deep intra-cervical block was placed with a mixture of Marcaine 0.5% (6 cc's) and Lidocaine 1% (6 cc's) by placing 3 cc's into each quadrant with a spinal needle. The endocervical canal was measured to 3.5 centimeters. The uterus was then sounded to 8 centimeters. The cervix was dilated to 8 mm with Hegar dilators. Hysteroscopy was then carried out using saline as the distention media.  Hysteroscopy revealed:  A normal endometrial cavity without perforation or myometrial damage. Both tubal ostia were visualized.  One small polyp<1cm.   It was decided to proceed with the Minerva ablation. The device was set for the appropriate cavity length and was placed into the uterus and deployed. The width was noted on the device to be in the approved Green [place]. The cervical balloon was inflated and the cervical seal was confirmed by the Minerva Multiplex Controller. The foot pedal was then pressed and the 2-part Uterine Integrity Test (UIT) confirmed the integrity of the cavity. Upon completion of the UIT, the treatment cycle lasted 120 seconds. The device was unlocked and removed.   Repeat hysteroscopy revealed that all visual areas of active endometrium had been adequately treated.  Polyp also seems to have been ablated and gone.   At this point the procedure is complete and the patient was awakened and in stable condition.   She will be allowed to return to her home, provided that  she is doing well. She will be instructed to call to report any increased bleeding, uncontrolled pain, fever, SOB, or any other significant change in her condition. She is given a postoperative instruction sheet and a follow up appointment for 2 weeks.   Annamarie Major, MD, [facility], [facility] 10/01/2018  1:39 PM

## 2018-10-01 NOTE — Patient Instructions (Signed)
    Endometrial Ablation Post-Procedural Instructions for Patient   You may experience mild to moderate cramping (like menstrual cramping) and pinkish watery discharge.  This may last approximately 2 to 3 weeks. Use pads, not tampons during this time.  No sexual activity for 3 weeks post procedure.  Follow up medications and directions for taking the medications are    NORCO 5/500 1 or 2 by mouth every 4 to 6 hours as needed IBUPROFEN 800 mg by mouth three times a day for the next two days PHENERGAN 25 mg by mouth q 6 hours for nausea       Call our office if you develop any of the following: ? Fever of 100.4 or greater  ? Worsening pelvic pain  ? Nausea  ? Vomiting  ? Greenish vaginal discharge with odor   Office phone number: [phone]   

## 2018-10-15 ENCOUNTER — Ambulatory Visit (INDEPENDENT_AMBULATORY_CARE_PROVIDER_SITE_OTHER): Payer: Commercial Managed Care - PPO | Admitting: Obstetrics & Gynecology

## 2018-10-15 ENCOUNTER — Other Ambulatory Visit: Payer: Self-pay

## 2018-10-15 ENCOUNTER — Encounter: Payer: Self-pay | Admitting: Obstetrics & Gynecology

## 2018-10-15 DIAGNOSIS — N920 Excessive and frequent menstruation with regular cycle: Secondary | ICD-10-CM | POA: Diagnosis not present

## 2018-10-15 NOTE — Progress Notes (Signed)
Virtual Visit via Telephone Note  I connected with Sabrina Kennedy on 10/15/18 at  3:10 PM EDT by telephone and verified that I am speaking with the correct person using two identifiers.   I discussed the limitations, risks, security and privacy concerns of performing an evaluation and management service by telephone and the availability of in person appointments. I also discussed with the patient that there may be a patient responsible charge related to this service. The patient expressed understanding and agreed to proceed. She was at home and I was in my office.  History of Present Illness: Pt has a history of menorrhagia, with a change in cycles to more frequent (q22-25 days) lasting 5-7 days w clots and pain.  Vasectomy, no desire for pregnancy.  [place] and EMB normal.  She underwent endometrial ablation 2 weeks ago.  Since that time, she has had min bleeding and no pain.  In fact no bleeding after day of procedure until today which she feels is the start of her period.     Observations/Objective: No exam today, due to telephone eVisit due to [facility] virus restriction on elective visits and procedures.  Prior visits reviewed along with ultrasounds/labs as indicated.  Assessment and Plan: 1. Menorrhagia with regular cycle Treated with ablation, to monitor now and see what her next few cycles are like (continued irreg and heavy, or improved).  Follow Up Instructions: Annual when due PRN based on periods   I discussed the assessment and treatment plan with the patient. The patient was provided an opportunity to ask questions and all were answered. The patient agreed with the plan and demonstrated an understanding of the instructions.   The patient was advised to call back or seek an in-person evaluation if the symptoms worsen or if the condition fails to improve as anticipated.  I provided 5 minutes of non-face-to-face time during this encounter.  Letitia Libra, MD [facility], [facility] 10/15/2018  3:23 PM

## 2019-02-16 ENCOUNTER — Telehealth: Payer: Self-pay | Admitting: Family Medicine

## 2019-02-16 MED ORDER — SUMATRIPTAN SUCCINATE 100 MG PO TABS: ORAL_TABLET | ORAL | 6 refills | Status: DC

## 2019-02-16 NOTE — Telephone Encounter (Signed)
Could the patient have a prescription for sumatriptan and use otc naproxen?  If not can you try to write the prescription stating generic Treximet- instead of the way it is written now.

## 2019-02-16 NOTE — Telephone Encounter (Signed)
Patient notified

## 2019-02-16 NOTE — Telephone Encounter (Signed)
Sent in plain imitrex, can take OTC NSAIDs prn

## 2019-02-16 NOTE — Telephone Encounter (Signed)
Please see what she would like instead

## 2019-02-16 NOTE — Telephone Encounter (Signed)
Medication Refill - Medication: SUMAtriptan-naproxen (TREXIMET) 85-500 MG tablet Pt stated that she received a letter from [facility] that this medication will not be covered by insurance after 7.1.20. Pt was advised to try have script written as the generic for Treximet. Pt would like a call to go over which Rx will be covered. Advised Pt to also check with insurance/ please advise   Has the patient contacted their pharmacy? Yes.   (Agent: If no, request that the patient contact the pharmacy for the refill.) (Agent: If yes, when and what did the pharmacy advise?)  Preferred Pharmacy (with phone number or street name):  [id number] - [place], [address] [phone] (Phone) [phone] (Fax)     Agent: Please be advised that RX refills may take up to 3 business days. We ask that you follow-up with your pharmacy.

## 2019-02-16 NOTE — Telephone Encounter (Signed)
Called and left a message asking patient to return my call to discuss medications.

## 2019-04-01 ENCOUNTER — Encounter: Payer: Self-pay | Admitting: Family Medicine

## 2019-04-01 ENCOUNTER — Other Ambulatory Visit: Payer: Self-pay

## 2019-04-01 ENCOUNTER — Ambulatory Visit: Payer: Commercial Managed Care - PPO | Admitting: Family Medicine

## 2019-04-01 VITALS — BP 130/88 | HR 72 | Temp 98.4°F | Ht 62.0 in | Wt 173.0 lb

## 2019-04-01 DIAGNOSIS — E6609 Other obesity due to excess calories: Secondary | ICD-10-CM

## 2019-04-01 DIAGNOSIS — Z23 Encounter for immunization: Secondary | ICD-10-CM | POA: Diagnosis not present

## 2019-04-01 DIAGNOSIS — L989 Disorder of the skin and subcutaneous tissue, unspecified: Secondary | ICD-10-CM

## 2019-04-01 DIAGNOSIS — Z6831 Body mass index (BMI) 31.0-31.9, adult: Secondary | ICD-10-CM | POA: Diagnosis not present

## 2019-04-01 MED ORDER — SAXENDA 18 MG/3ML ~~LOC~~ SOPN: PEN_INJECTOR | SUBCUTANEOUS | 2 refills | Status: DC

## 2019-04-01 MED ORDER — MUPIROCIN 2 % EX OINT: 1.0000 "application " | TOPICAL_OINTMENT | Freq: Two times a day (BID) | CUTANEOUS | 0 refills | Status: DC

## 2019-04-01 MED ORDER — PEN NEEDLES 32G X 4 MM MISC: 1.0000 [IU] | Freq: Every day | 1 refills | Status: DC

## 2019-04-01 NOTE — Progress Notes (Signed)
BP 130/88   Pulse 72   Temp 98.4 F (36.9 C) (Oral)   Ht 5\' 2"  (1.575 m)   Wt 173 lb (78.5 kg)   SpO2 99%   BMI 31.64 kg/m    Subjective:    Patient ID: Sabrina Kennedy, female    DOB: 1981-04-30, 39 y.o.   MRN: [id number]  HPI: Sabrina Kennedy is a 38 y.o. female  Chief Complaint  Patient presents with  . Weight Gain  . Spasms    bilateral leg cramps for few years. gotten worse lately  . Insect Bite    2 weeks ago on back of neck. pt states that seems like it is not healing   Struggling wight weight gain the past 5 years or so no matter what types of diet and exercise programs tried. Has tried 3/10 shakes and working out 1-2 times per day several years ago and was able to get down into the 140s with that but that was not sustainable for her. Has also tried keto diet for about 4 months last year and lost about 10 lb without exercising but that also came back plus some. Did start keto back up Wednesday and tries to walk and ride her bike some days per week. Works long days and has trouble fitting in exercise right now with her current schedule. Has never tried prescription weight loss medications.   Ant bite on the back of her neck that has become some open sores. Tender to the touch. States this seems to happen whenever she's bitten by an ant and thinks she's allergic to them. Denies redness, drainage, fevers. Has not been putting anything on the areas.   Relevant past medical, surgical, family and social history reviewed and updated as indicated. Interim medical history since our last visit reviewed. Allergies and medications reviewed and updated.  Review of Systems  Per HPI unless specifically indicated above     Objective:    BP 130/88   Pulse 72   Temp 98.4 F (36.9 C) (Oral)   Ht 5\' 2"  (1.575 m)   Wt 173 lb (78.5 kg)   SpO2 99%   BMI 31.64 kg/m   Wt Readings from Last 3 Encounters:  04/01/19 173 lb (78.5 kg)  10/01/18 166 lb (75.3 kg)  09/27/18 162 lb (73.5  kg)    Physical Exam Vitals signs and nursing note reviewed.  Constitutional:      Appearance: Normal appearance. She is not ill-appearing.  HENT:     Head: Atraumatic.  Eyes:     Extraocular Movements: Extraocular movements intact.     Conjunctiva/sclera: Conjunctivae normal.  Neck:     Musculoskeletal: Normal range of motion and neck supple.  Cardiovascular:     Rate and Rhythm: Normal rate and regular rhythm.     Heart sounds: Normal heart sounds.  Pulmonary:     Effort: Pulmonary effort is normal.     Breath sounds: Normal breath sounds.  Musculoskeletal: Normal range of motion.  Skin:    General: Skin is warm and dry.     Comments: Two healing ulcers posterior neck, no drainage, erythema  Neurological:     Mental Status: She is alert and oriented to person, place, and time.  Psychiatric:        Mood and Affect: Mood normal.        Thought Content: Thought content normal.        Judgment: Judgment normal.     Results for orders placed or  performed in visit on 04/27/18  Cytology - PAP  Result Value Ref Range   Adequacy      Satisfactory for evaluation  endocervical/transformation zone component PRESENT.   Diagnosis      NEGATIVE FOR INTRAEPITHELIAL LESIONS OR MALIGNANCY.   Material Submitted CervicoVaginal Pap [ThinPrep Imaged]       Assessment & Plan:   Problem List Items Addressed This Visit      Other   Obesity - Primary    Discussed numerous weight loss options including wellbutrin, contrave, saxenda, and topamax. Pt interested in trying saxenda. Also discussed good lifestyle habits as mainstay of weight loss regimen. F/u in 1 month for recheck after starting the medication      Relevant Medications   Liraglutide -Weight Management (SAXENDA) 18 MG/3ML SOPN    Other Visit Diagnoses    Skin lesion       From ant bites. Healing well, bactroban sent and wound care reviewed   Flu vaccine need       Relevant Orders   Flu Vaccine QUAD 36+ mos IM (Completed)        Follow up plan: Return in about 4 weeks (around 04/29/2019) for Weight gain.

## 2019-04-01 NOTE — Patient Instructions (Signed)
Liraglutide injection (Weight Management) What is this medicine? LIRAGLUTIDE (LIR a GLOO tide) is used to help people lose weight and maintain weight loss. It is used with a reduced-calorie diet and exercise. This medicine may be used for other purposes; ask your health care provider or pharmacist if you have questions. COMMON BRAND NAME(S): Saxenda What should I tell my health care provider before I take this medicine? They need to know if you have any of these conditions:  endocrine tumors (MEN 2) or if someone in your family had these tumors  gallbladder disease  high cholesterol  history of alcohol abuse problem  history of pancreatitis  kidney disease or if you are on dialysis  liver disease  previous swelling of the tongue, face, or lips with difficulty breathing, difficulty swallowing, hoarseness, or tightening of the throat  stomach problems  suicidal thoughts, plans, or attempt; a previous suicide attempt by you or a family member  thyroid cancer or if someone in your family had thyroid cancer  an unusual or allergic reaction to liraglutide, other medicines, foods, dyes, or preservatives  pregnant or trying to get pregnant  breast-feeding How should I use this medicine? This medicine is for injection under the skin of your upper leg, stomach area, or upper arm. You will be taught how to prepare and give this medicine. Use exactly as directed. Take your medicine at regular intervals. Do not take it more often than directed. It is important that you put your used needles and syringes in a special sharps container. Do not put them in a trash can. If you do not have a sharps container, call your pharmacist or healthcare provider to get one. A special MedGuide will be given to you by the pharmacist with each prescription and refill. Be sure to read this information carefully each time. Talk to your pediatrician regarding the use of this medicine in children. Special care  may be needed. Overdosage: If you think you have taken too much of this medicine contact a poison control center or emergency room at once. NOTE: This medicine is only for you. Do not share this medicine with others. What if I miss a dose? If you miss a dose, take it as soon as you can. If it is almost time for your next dose, take only that dose. Do not take double or extra doses. If you miss your dose for 3 days or more, call your doctor or health care professional to talk about how to restart this medicine. What may interact with this medicine?  insulin and other medicines for diabetes This list may not describe all possible interactions. Give your health care provider a list of all the medicines, herbs, non-prescription drugs, or dietary supplements you use. Also tell them if you smoke, drink alcohol, or use illegal drugs. Some items may interact with your medicine. What should I watch for while using this medicine? Visit your doctor or health care professional for regular checks on your progress. This medicine is intended to be used in addition to a healthy diet and appropriate exercise. The best results are achieved this way. Do not increase or in any way change your dose without consulting your doctor or health care professional. Drink plenty of fluids while taking this medicine. Check with your doctor or health care professional if you get an attack of severe diarrhea, nausea, and vomiting. The loss of too much body fluid can make it dangerous for you to take this medicine. This medicine may   affect blood sugar levels. If you have diabetes, check with your doctor or health care professional before you change your diet or the dose of your diabetic medicine. Patients and their families should watch out for worsening depression or thoughts of suicide. Also watch out for sudden changes in feelings such as feeling anxious, agitated, panicky, irritable, hostile, aggressive, impulsive, severely  restless, overly excited and hyperactive, or not being able to sleep. If this happens, especially at the beginning of treatment or after a change in dose, call your health care professional. What side effects may I notice from receiving this medicine? Side effects that you should report to your doctor or health care professional as soon as possible:  allergic reactions like skin rash, itching or hives, swelling of the face, lips, or tongue  breathing problems  diarrhea that continues or is severe  lump or swelling on the neck  severe nausea  signs and symptoms of infection like fever or chills; cough; sore throat; pain or trouble passing urine  signs and symptoms of low blood sugar such as feeling anxious, confusion, dizziness, increased hunger, unusually weak or tired, sweating, shakiness, cold, irritable, headache, blurred vision, fast heartbeat, loss of consciousness  signs and symptoms of kidney injury like trouble passing urine or change in the amount of urine  trouble swallowing  unusual stomach upset or pain  vomiting Side effects that usually do not require medical attention (report to your doctor or health care professional if they continue or are bothersome):  constipation  decreased appetite  diarrhea  fatigue  headache  nausea  pain, redness, or irritation at site where injected  stomach upset  stuffy or runny nose This list may not describe all possible side effects. Call your doctor for medical advice about side effects. You may report side effects to FDA at 1-800-FDA-1088. Where should I keep my medicine? Keep out of the reach of children. Store unopened pen in a refrigerator between 2 and 8 degrees C (36 and 46 degrees F). Do not freeze or use if the medicine has been frozen. Protect from light and excessive heat. After you first use the pen, it can be stored at room temperature between 15 and 30 degrees C (59 and 86 degrees F) or in a refrigerator.  Throw away your used pen after 30 days or after the expiration date, whichever comes first. Do not store your pen with the needle attached. If the needle is left on, medicine may leak from the pen. NOTE: This sheet is a summary. It may not cover all possible information. If you have questions about this medicine, talk to your doctor, pharmacist, or health care provider.  2020 Elsevier/Gold Standard (2018-08-13 17:10:35)  

## 2019-04-04 ENCOUNTER — Telehealth: Payer: Self-pay

## 2019-04-04 DIAGNOSIS — E669 Obesity, unspecified: Secondary | ICD-10-CM | POA: Insufficient documentation

## 2019-04-04 DIAGNOSIS — E663 Overweight: Secondary | ICD-10-CM | POA: Insufficient documentation

## 2019-04-04 NOTE — Telephone Encounter (Signed)
Prior Authorization initiated via CoverMyMeds for Saxenda 18MG /3ML pen-injectors Key: [id number]  PA not sent to plan yet, awaiting provider to sign progress note.

## 2019-04-04 NOTE — Assessment & Plan Note (Signed)
Discussed numerous weight loss options including wellbutrin, contrave, saxenda, and topamax. Pt interested in trying saxenda. Also discussed good lifestyle habits as mainstay of weight loss regimen. F/u in 1 month for recheck after starting the medication

## 2019-04-04 NOTE — Telephone Encounter (Signed)
PA sent

## 2019-04-05 NOTE — Telephone Encounter (Signed)
PA Approved

## 2019-04-29 ENCOUNTER — Encounter: Payer: Self-pay | Admitting: Family Medicine

## 2019-04-29 ENCOUNTER — Ambulatory Visit: Payer: Commercial Managed Care - PPO | Admitting: Family Medicine

## 2019-04-29 ENCOUNTER — Other Ambulatory Visit: Payer: Self-pay

## 2019-04-29 VITALS — BP 108/73 | HR 88 | Temp 97.8°F | Wt 160.8 lb

## 2019-04-29 DIAGNOSIS — E6609 Other obesity due to excess calories: Secondary | ICD-10-CM | POA: Diagnosis not present

## 2019-04-29 DIAGNOSIS — Z6831 Body mass index (BMI) 31.0-31.9, adult: Secondary | ICD-10-CM | POA: Diagnosis not present

## 2019-04-29 MED ORDER — SAXENDA 18 MG/3ML ~~LOC~~ SOPN: 3.0000 mg | PEN_INJECTOR | Freq: Every day | SUBCUTANEOUS | 9 refills | Status: DC

## 2019-04-29 NOTE — Assessment & Plan Note (Signed)
Down almost 15 lb, congratulated success. Will continue saxenda and recheck in 3 months. Continue good diet and exercise modifications

## 2019-04-29 NOTE — Progress Notes (Signed)
   BP 108/73   Pulse 88   Temp 97.8 F (36.6 C) (Oral)   Wt 160 lb 12.8 oz (72.9 kg)   SpO2 98%   BMI 29.41 kg/m    Subjective:    Patient ID: Sabrina Kennedy, female    DOB: March 23, 1981, 38 y.o.   MRN: [id number]  HPI: Sabrina Kennedy is a 38 y.o. female  Chief Complaint  Patient presents with  . Weight Check   Patient presenting today for a weight check 1 month after starting saxenda. Some mild bloating and nausea initially but she's been limiting her portion sizes and not having any issues currently. Down almost 15 lb already. Following a loose keto diet and exercising fairly regularly. Denies hypoglycemic episodes, vomiting, diarrhea.    Relevant past medical, surgical, family and social history reviewed and updated as indicated. Interim medical history since our last visit reviewed. Allergies and medications reviewed and updated.  Review of Systems  Per HPI unless specifically indicated above     Objective:    BP 108/73   Pulse 88   Temp 97.8 F (36.6 C) (Oral)   Wt 160 lb 12.8 oz (72.9 kg)   SpO2 98%   BMI 29.41 kg/m   Wt Readings from Last 3 Encounters:  04/29/19 160 lb 12.8 oz (72.9 kg)  04/01/19 173 lb (78.5 kg)  10/01/18 166 lb (75.3 kg)    Physical Exam Vitals signs and nursing note reviewed.  Constitutional:      Appearance: Normal appearance. She is not ill-appearing.  HENT:     Head: Atraumatic.  Eyes:     Extraocular Movements: Extraocular movements intact.     Conjunctiva/sclera: Conjunctivae normal.  Neck:     Musculoskeletal: Normal range of motion and neck supple.  Cardiovascular:     Rate and Rhythm: Normal rate and regular rhythm.     Heart sounds: Normal heart sounds.  Pulmonary:     Effort: Pulmonary effort is normal.     Breath sounds: Normal breath sounds.  Musculoskeletal: Normal range of motion.  Skin:    General: Skin is warm and dry.  Neurological:     Mental Status: She is alert and oriented to person, place, and time.   Psychiatric:        Mood and Affect: Mood normal.        Thought Content: Thought content normal.        Judgment: Judgment normal.     Results for orders placed or performed in visit on 04/27/18  Cytology - PAP  Result Value Ref Range   Adequacy      Satisfactory for evaluation  endocervical/transformation zone component PRESENT.   Diagnosis      NEGATIVE FOR INTRAEPITHELIAL LESIONS OR MALIGNANCY.   Material Submitted CervicoVaginal Pap [ThinPrep Imaged]       Assessment & Plan:   Problem List Items Addressed This Visit      Other   Obesity - Primary    Down almost 15 lb, congratulated success. Will continue saxenda and recheck in 3 months. Continue good diet and exercise modifications      Relevant Medications   Liraglutide -Weight Management (SAXENDA) 18 MG/3ML SOPN       Follow up plan: Return in about 3 months (around 07/30/2019) for weight check.

## 2019-06-29 ENCOUNTER — Other Ambulatory Visit: Payer: Self-pay | Admitting: Family Medicine

## 2019-06-29 MED ORDER — SUMATRIPTAN SUCCINATE 100 MG PO TABS: ORAL_TABLET | ORAL | 6 refills | Status: DC

## 2019-06-29 NOTE — Telephone Encounter (Signed)
Medication refill: SUMAtriptan (IMITREX) 100 MG tablet [[id number]]   Pharmacy:  [id number] - [place], [address] [phone] (Phone) [phone] (Fax)     Pt would like a 90 day supply. Pt states that she called the insurance company and they would cover. Please advise

## 2019-07-29 ENCOUNTER — Encounter: Payer: Self-pay | Admitting: Family Medicine

## 2019-07-29 ENCOUNTER — Other Ambulatory Visit: Payer: Self-pay

## 2019-07-29 ENCOUNTER — Ambulatory Visit (INDEPENDENT_AMBULATORY_CARE_PROVIDER_SITE_OTHER): Payer: Commercial Managed Care - PPO | Admitting: Family Medicine

## 2019-07-29 VITALS — BP 119/78 | HR 76 | Temp 97.7°F | Wt 150.2 lb

## 2019-07-29 DIAGNOSIS — E663 Overweight: Secondary | ICD-10-CM | POA: Diagnosis not present

## 2019-07-29 DIAGNOSIS — G43909 Migraine, unspecified, not intractable, without status migrainosus: Secondary | ICD-10-CM

## 2019-07-29 MED ORDER — SUMATRIPTAN SUCCINATE 100 MG PO TABS: ORAL_TABLET | ORAL | 1 refills | Status: DC

## 2019-07-29 MED ORDER — FLUTICASONE PROPIONATE 50 MCG/ACT NA SUSP: 2.0000 | Freq: Two times a day (BID) | NASAL | 6 refills | Status: DC

## 2019-07-29 NOTE — Progress Notes (Signed)
BP 119/78   Pulse 76   Temp 97.7 F (36.5 C) (Temporal)   Wt 150 lb 4 oz (68.2 kg)   BMI 27.48 kg/m    Subjective:    Patient ID: Sabrina Kennedy, female    DOB: 1981-06-06, 39 y.o.   MRN: [id number]  HPI: Sabrina Kennedy is a 39 y.o. female  Chief Complaint  Patient presents with  . Weight Check    . This visit was completed via WebEx due to the restrictions of the COVID-19 pandemic. All issues as above were discussed and addressed. Physical exam was done as above through visual confirmation on WebEx. If it was felt that the patient should be evaluated in the office, they were directed there. The patient verbally consented to this visit. . Location of the patient: home . Location of the provider: home . Those involved with this call:  . Provider: Merrie Roof, PA-C . CMA: Lesle Chris, S.N.P.J. . Front Desk/Registration: Jill Side  . Time spent on call: 15 minutes with patient face to face via video conference. More than 50% of this time was spent in counseling and coordination of care. 5 minutes total spent in review of patient's record and preparation of their chart. I verified patient identity using two factors (patient name and date of birth). Patient consents verbally to being seen via telemedicine visit today.   Patient presents today for weight check. Continues to do very well on the saxenda. Eating well and exercising. States she fell off the wagon a bit during the holidays but back on track now. Goal weight is 130s range. So far has lost about 25 lb. No side effects with the medication.    Getting migraines more frequently, imitrex does seem to help but she's running out quicker than usual. Insurance approved larger quantities to help make the medication more affordable. Typically only getting 5 or so migraines a month up until the past month which was most days having mild headache. Denies visual changes, N/V, disorientation with these.   Relevant past medical, surgical,  family and social history reviewed and updated as indicated. Interim medical history since our last visit reviewed. Allergies and medications reviewed and updated.  Review of Systems  Per HPI unless specifically indicated above     Objective:    BP 119/78   Pulse 76   Temp 97.7 F (36.5 C) (Temporal)   Wt 150 lb 4 oz (68.2 kg)   BMI 27.48 kg/m   Wt Readings from Last 3 Encounters:  07/29/19 150 lb 4 oz (68.2 kg)  04/29/19 160 lb 12.8 oz (72.9 kg)  04/01/19 173 lb (78.5 kg)    Physical Exam Vitals and nursing note reviewed.  Constitutional:      General: She is not in acute distress.    Appearance: Normal appearance.  HENT:     Head: Atraumatic.     Right Ear: External ear normal.     Left Ear: External ear normal.     Nose: Nose normal. No congestion.     Mouth/Throat:     Mouth: Mucous membranes are moist.     Pharynx: Oropharynx is clear. No posterior oropharyngeal erythema.  Eyes:     Extraocular Movements: Extraocular movements intact.     Conjunctiva/sclera: Conjunctivae normal.  Cardiovascular:     Comments: Unable to assess via virtual visit Pulmonary:     Effort: Pulmonary effort is normal. No respiratory distress.  Musculoskeletal:        General: Normal range  of motion.     Cervical back: Normal range of motion.  Skin:    General: Skin is dry.     Findings: No erythema.  Neurological:     Mental Status: She is alert and oriented to person, place, and time.  Psychiatric:        Mood and Affect: Mood normal.        Thought Content: Thought content normal.        Judgment: Judgment normal.     Results for orders placed or performed in visit on 04/27/18  Cytology - PAP  Result Value Ref Range   Adequacy      Satisfactory for evaluation  endocervical/transformation zone component PRESENT.   Diagnosis      NEGATIVE FOR INTRAEPITHELIAL LESIONS OR MALIGNANCY.   Material Submitted CervicoVaginal Pap [ThinPrep Imaged]       Assessment & Plan:    Problem List Items Addressed This Visit      Cardiovascular and Mediastinum   Migraine - Primary    Currently having more migraines than usual. Will write for higher quantitiy of imitrex to help with cost. Declines adding any medications at this time for better control. Continue to monitor.       Relevant Medications   SUMAtriptan (IMITREX) 100 MG tablet     Other   Overweight (BMI 25.0-29.9)    Congratulated success, continue saxenda and good lifestyle changes          Follow up plan: Return in about 3 months (around 10/27/2019) for weight and migraine f/u.

## 2019-08-03 ENCOUNTER — Telehealth: Payer: Self-pay

## 2019-08-03 NOTE — Telephone Encounter (Signed)
PA for Saxenda initiated and submitted via Cover My Meds. Key: [id number]

## 2019-08-03 NOTE — Telephone Encounter (Signed)
PA approved.

## 2019-08-03 NOTE — Assessment & Plan Note (Signed)
Currently having more migraines than usual. Will write for higher quantitiy of imitrex to help with cost. Declines adding any medications at this time for better control. Continue to monitor.

## 2019-08-03 NOTE — Assessment & Plan Note (Signed)
Congratulated success, continue saxenda and good lifestyle changes

## 2019-08-04 ENCOUNTER — Encounter: Payer: Self-pay | Admitting: Family Medicine

## 2019-08-04 NOTE — Progress Notes (Signed)
Called pt, no answer, left vm. Sent mychart message and sent letter. 

## 2019-09-26 ENCOUNTER — Telehealth: Payer: Self-pay | Admitting: Family Medicine

## 2019-09-26 ENCOUNTER — Other Ambulatory Visit: Payer: Self-pay

## 2019-09-26 ENCOUNTER — Ambulatory Visit (INDEPENDENT_AMBULATORY_CARE_PROVIDER_SITE_OTHER): Payer: Commercial Managed Care - PPO | Admitting: Family Medicine

## 2019-09-26 ENCOUNTER — Encounter: Payer: Self-pay | Admitting: Family Medicine

## 2019-09-26 VITALS — BP 125/93 | HR 84 | Temp 98.5°F | Ht 62.0 in | Wt 148.0 lb

## 2019-09-26 DIAGNOSIS — K649 Unspecified hemorrhoids: Secondary | ICD-10-CM | POA: Diagnosis not present

## 2019-09-26 DIAGNOSIS — K5900 Constipation, unspecified: Secondary | ICD-10-CM

## 2019-09-26 DIAGNOSIS — Z23 Encounter for immunization: Secondary | ICD-10-CM | POA: Diagnosis not present

## 2019-09-26 MED ORDER — HYDROCORTISONE (PERIANAL) 2.5 % EX CREA: 1.0000 "application " | TOPICAL_CREAM | Freq: Two times a day (BID) | CUTANEOUS | 0 refills | Status: DC

## 2019-09-26 NOTE — Progress Notes (Signed)
BP (!) 125/93   Pulse 84   Temp 98.5 F (36.9 C) (Oral)   Ht 5\' 2"  (1.575 m)   Wt 148 lb (67.1 kg)   SpO2 98%   BMI 27.07 kg/m    Subjective:    Patient ID: Sabrina Kennedy, female    DOB: 1980-08-22, 39 y.o.   MRN: [id number]  HPI: Sabrina Kennedy is a 39 y.o. female  Chief Complaint  Patient presents with  . Hemorrhoids    very painfull. pt would like a referral for GI  . Constipation   Hemorrhoid x 5 plus years now. Has been constipated lately and straining, which has flared up the issue significantly. Now having anal pain since yesterday which she's never had pain with this in the past. Denies bleeding, painful BMs, abdominal pain. Trying prep H and witch hazel wipes since yesterday with no relief.     Relevant past medical, surgical, family and social history reviewed and updated as indicated. Interim medical history since our last visit reviewed. Allergies and medications reviewed and updated.  Review of Systems  Per HPI unless specifically indicated above     Objective:    BP (!) 125/93   Pulse 84   Temp 98.5 F (36.9 C) (Oral)   Ht 5\' 2"  (1.575 m)   Wt 148 lb (67.1 kg)   SpO2 98%   BMI 27.07 kg/m   Wt Readings from Last 3 Encounters:  09/26/19 148 lb (67.1 kg)  07/29/19 150 lb 4 oz (68.2 kg)  04/29/19 160 lb 12.8 oz (72.9 kg)    Physical Exam Vitals and nursing note reviewed.  Constitutional:      Appearance: Normal appearance. She is not ill-appearing.  HENT:     Head: Atraumatic.  Eyes:     Extraocular Movements: Extraocular movements intact.     Conjunctiva/sclera: Conjunctivae normal.  Cardiovascular:     Rate and Rhythm: Normal rate and regular rhythm.     Heart sounds: Normal heart sounds.  Pulmonary:     Effort: Pulmonary effort is normal.     Breath sounds: Normal breath sounds.  Genitourinary:   Musculoskeletal:        General: Normal range of motion.     Cervical back: Normal range of motion and neck supple.  Skin:  General: Skin is warm and dry.  Neurological:     Mental Status: She is alert and oriented to person, place, and time.  Psychiatric:        Mood and Affect: Mood normal.        Thought Content: Thought content normal.        Judgment: Judgment normal.     Results for orders placed or performed in visit on 04/27/18  Cytology - PAP  Result Value Ref Range   Adequacy      Satisfactory for evaluation  endocervical/transformation zone component PRESENT.   Diagnosis      NEGATIVE FOR INTRAEPITHELIAL LESIONS OR MALIGNANCY.   Material Submitted CervicoVaginal Pap [ThinPrep Imaged]       Assessment & Plan:   Problem List Items Addressed This Visit    None    Visit Diagnoses    Hemorrhoids, unspecified hemorrhoid type    -  Primary   Discussed anusol, sitz baths, tucks wipes, bowel regimen. Referral placed to GI per pt request   Relevant Orders   Ambulatory referral to Gastroenterology   Constipation, unspecified constipation type       Discussed good bowel regimen wtih fiber,  miralax, fluids.   Need for diphtheria-tetanus-pertussis (Tdap) vaccine       Relevant Orders   Tdap vaccine greater than or equal to 7yo IM (Completed)       Follow up plan: Return for as scheduled.

## 2019-09-26 NOTE — Telephone Encounter (Signed)
Referral generated during OV today  Copied from CRM [id number]. Topic: Referral - Request for Referral >> Sep 20, 2019 12:42 PM Reggie Pile, NT wrote: Has patient seen PCP for this complaint? No, and no *If NO, is insurance requiring patient see PCP for this issue before PCP can refer them? Referral for which specialty: gastroenterologist  Preferred provider/office: [facility]  Reason for referral: constipation on and off for 5 years, increasing >> Sep 26, 2019  9:36 AM Harriet Pho wrote: Pt scheduled today at 11 >> Sep 26, 2019  8:16 AM Frances Furbish L wrote: Pt calling back to check status. Phone Number [phone] >> Sep 22, 2019 11:18 AM Adela Ports M wrote: Jeanene Erb pt, number invalid.

## 2019-10-03 ENCOUNTER — Ambulatory Visit (INDEPENDENT_AMBULATORY_CARE_PROVIDER_SITE_OTHER): Payer: Commercial Managed Care - PPO | Admitting: Obstetrics & Gynecology

## 2019-10-03 ENCOUNTER — Other Ambulatory Visit: Payer: Self-pay

## 2019-10-03 ENCOUNTER — Encounter: Payer: Self-pay | Admitting: Obstetrics & Gynecology

## 2019-10-03 VITALS — BP 120/80 | Ht 62.0 in | Wt 150.0 lb

## 2019-10-03 DIAGNOSIS — Z01419 Encounter for gynecological examination (general) (routine) without abnormal findings: Secondary | ICD-10-CM | POA: Diagnosis not present

## 2019-10-03 DIAGNOSIS — Z1231 Encounter for screening mammogram for malignant neoplasm of breast: Secondary | ICD-10-CM

## 2019-10-03 NOTE — Progress Notes (Signed)
HPI:      [name] is a 39 y.o. G1P1001 who LMP was No LMP recorded. Patient has had an ablation., she presents today for her annual examination. The patient has no complaints today. The patient is sexually active. Her last pap: approximate date 2019 and was normal and last mammogram: patient has never had a mammogram. The patient does perform self breast exams.  There is no notable family history of breast or ovarian cancer in her family.  The patient has regular exercise: yes.  The patient denies current symptoms of depression.    GYN History: Contraception: vasectomy  PMHx: Past Medical History:  Diagnosis Date  . Allergy   . Migraine    Past Surgical History:  Procedure Laterality Date  . NASAL SINUS SURGERY    . tonsils surgery     Family History  Problem Relation Age of Onset  . Diabetes Mother   . Hypertension Mother   . Asthma Mother   . Cancer Mother   . Thyroid disease Mother   . Diabetes Paternal Grandfather    Social History   Tobacco Use  . Smoking status: Never Smoker  . Smokeless tobacco: Never Used  Substance Use Topics  . Alcohol use: Not Currently  . Drug use: Never    Current Outpatient Medications:  .  Liraglutide -Weight Management (SAXENDA) 18 MG/3ML SOPN, Inject 3 mg into the skin daily., Disp: 3 mL, Rfl: 9 .  cetirizine (ZYRTEC) 10 MG tablet, Take 1 tablet (10 mg total) by mouth daily. (Patient not taking: Reported on 10/03/2019), Disp: 30 tablet, Rfl: 11 .  clindamycin (CLEOCIN T) 1 % lotion, APPLY TO FACE DAILY IN THE MORNING, Disp: , Rfl: 2 .  fluticasone (FLONASE) 50 MCG/ACT nasal spray, Place 2 sprays into both nostrils 2 (two) times daily. (Patient not taking: Reported on 10/03/2019), Disp: 16 g, Rfl: 6 .  hydrocortisone (ANUSOL-HC) 2.5 % rectal cream, Place 1 application rectally 2 (two) times daily. (Patient not taking: Reported on 10/03/2019), Disp: 60 g, Rfl: 0 .  Insulin Pen Needle (PEN NEEDLES) 32G X 4 MM MISC, 1 Units by Does  not apply route daily. (Patient not taking: Reported on 10/03/2019), Disp: 100 each, Rfl: 1 .  naproxen sodium (ALEVE) 220 MG tablet, Take 220 mg by mouth as needed., Disp: , Rfl:  .  SUMAtriptan (IMITREX) 100 MG tablet, Take one at onset of migraine. May repeat in 2 hours if headache persists or recurs. (Patient not taking: Reported on 10/03/2019), Disp: 180 tablet, Rfl: 1 Allergies: Prochlorperazine edisylate and Topiramate  Review of Systems  Constitutional: Negative for chills, fever and malaise/fatigue.  HENT: Negative for congestion, sinus pain and sore throat.   Eyes: Negative for blurred vision and pain.  Respiratory: Negative for cough and wheezing.   Cardiovascular: Negative for chest pain and leg swelling.  Gastrointestinal: Negative for abdominal pain, constipation, diarrhea, heartburn, nausea and vomiting.  Genitourinary: Negative for dysuria, frequency, hematuria and urgency.  Musculoskeletal: Negative for back pain, joint pain, myalgias and neck pain.  Skin: Negative for itching and rash.  Neurological: Negative for dizziness, tremors and weakness.  Endo/Heme/Allergies: Does not bruise/bleed easily.  Psychiatric/Behavioral: Negative for depression. The patient is not nervous/anxious and does not have insomnia.     Objective: BP 120/80   Ht 5\' 2"  (1.575 m)   Wt 150 lb (68 kg)   BMI 27.44 kg/m   Filed Weights   10/03/19 1529  Weight: 150 lb (68 kg)  Body mass index is 27.44 kg/m. Physical Exam Constitutional:      General: She is not in acute distress.    Appearance: She is well-developed.  Genitourinary:     Pelvic exam was performed with patient supine.     Vagina, uterus and rectum normal.     No lesions in the vagina.     No vaginal bleeding.     No cervical motion tenderness, friability, lesion or polyp.     Uterus is mobile.     Uterus is not enlarged.     No uterine mass detected.    Uterus is midaxial.     No right or left adnexal mass present.      Right adnexa not tender.     Left adnexa not tender.  HENT:     Head: Normocephalic and atraumatic. No laceration.     Right Ear: Hearing normal.     Left Ear: Hearing normal.     Mouth/Throat:     Pharynx: Uvula midline.  Eyes:     Pupils: Pupils are equal, round, and reactive to light.  Neck:     Thyroid: No thyromegaly.  Cardiovascular:     Rate and Rhythm: Normal rate and regular rhythm.     Heart sounds: No murmur. No friction rub. No gallop.   Pulmonary:     Effort: Pulmonary effort is normal. No respiratory distress.     Breath sounds: Normal breath sounds. No wheezing.  Chest:     Breasts:        Right: No mass, skin change or tenderness.        Left: No mass, skin change or tenderness.  Abdominal:     General: Bowel sounds are normal. There is no distension.     Palpations: Abdomen is soft.     Tenderness: There is no abdominal tenderness. There is no rebound.  Musculoskeletal:        General: Normal range of motion.     Cervical back: Normal range of motion and neck supple.  Neurological:     Mental Status: She is alert and oriented to person, place, and time.     Cranial Nerves: No cranial nerve deficit.  Skin:    General: Skin is warm and dry.  Psychiatric:        Judgment: Judgment normal.  Vitals reviewed.     Assessment:  ANNUAL EXAM 1. Women's annual routine gynecological examination   2. Encounter for screening mammogram for malignant neoplasm of breast      Screening Plan:            1.  Cervical Screening-  Pap smear schedule reviewed with patient  2. Breast screening- Exam annually and mammogram>40 planned   3. Labs managed by PCP  4. Counseling for contraception: vasectomy     F/U  Return in about 1 year (around 10/02/2020) for Annual.  Barnett Applebaum, MD, [facility], [facility] 10/03/2019  3:53 PM

## 2019-10-03 NOTE — Patient Instructions (Signed)
PAP every three years Mammogram every year    Call [phone] to schedule at [place]  

## 2019-10-18 ENCOUNTER — Ambulatory Visit
Admission: RE | Admit: 2019-10-18 | Discharge: 2019-10-18 | Disposition: A | Discharge status: Home / Self Care | Payer: Commercial Managed Care - PPO | Source: Ambulatory Visit | Attending: Obstetrics & Gynecology | Admitting: Obstetrics & Gynecology

## 2019-10-18 DIAGNOSIS — Z1231 Encounter for screening mammogram for malignant neoplasm of breast: Secondary | ICD-10-CM | POA: Diagnosis not present

## 2019-10-19 ENCOUNTER — Other Ambulatory Visit: Payer: Self-pay | Admitting: Obstetrics & Gynecology

## 2019-10-19 DIAGNOSIS — R928 Other abnormal and inconclusive findings on diagnostic imaging of breast: Secondary | ICD-10-CM

## 2019-10-19 DIAGNOSIS — N6320 Unspecified lump in the left breast, unspecified quadrant: Secondary | ICD-10-CM

## 2019-10-19 NOTE — Progress Notes (Signed)
MMG results d/w pt, additional LEFT imaging needed and to be scheduled soon Annamarie Major, MD, [facility], [facility] 10/19/2019  9:46 AM

## 2019-10-24 ENCOUNTER — Ambulatory Visit
Admission: RE | Admit: 2019-10-24 | Discharge: 2019-10-24 | Disposition: A | Discharge status: Home / Self Care | Payer: Commercial Managed Care - PPO | Source: Ambulatory Visit | Attending: Obstetrics & Gynecology | Admitting: Obstetrics & Gynecology

## 2019-10-24 DIAGNOSIS — N6320 Unspecified lump in the left breast, unspecified quadrant: Secondary | ICD-10-CM

## 2019-10-24 DIAGNOSIS — R928 Other abnormal and inconclusive findings on diagnostic imaging of breast: Secondary | ICD-10-CM | POA: Diagnosis not present

## 2019-11-11 ENCOUNTER — Ambulatory Visit: Payer: Commercial Managed Care - PPO | Admitting: Family Medicine

## 2019-11-11 ENCOUNTER — Encounter: Payer: Self-pay | Admitting: Family Medicine

## 2019-11-11 ENCOUNTER — Other Ambulatory Visit: Payer: Self-pay

## 2019-11-11 VITALS — BP 107/71 | HR 83 | Temp 98.3°F | Wt 146.0 lb

## 2019-11-11 DIAGNOSIS — E663 Overweight: Secondary | ICD-10-CM

## 2019-11-11 DIAGNOSIS — K5900 Constipation, unspecified: Secondary | ICD-10-CM

## 2019-11-11 DIAGNOSIS — G43909 Migraine, unspecified, not intractable, without status migrainosus: Secondary | ICD-10-CM | POA: Diagnosis not present

## 2019-11-11 NOTE — Assessment & Plan Note (Signed)
Continues to do well on saxenda, continue current regimen and good lifestyle habits

## 2019-11-11 NOTE — Progress Notes (Signed)
BP 107/71   Pulse 83   Temp 98.3 F (36.8 C) (Oral)   Wt 146 lb (66.2 kg)   SpO2 100%   BMI 26.70 kg/m    Subjective:    Patient ID: Sabrina Kennedy, female    DOB: Dec 22, 1980, 39 y.o.   MRN: [id number]  HPI: Sabrina Kennedy is a 39 y.o. female  Chief Complaint  Patient presents with  . Weight Check  . Migraine   Here today for 3 month f/u.   Has not been having any migraines since the warmer weather, states the issues always seem to come with winter weather. Does have the imitrex for prn use but has not had issues lately.   Still dealing with constipation and bloating, awaiting going to GI in 2 weeks for this but it's been ongoing for 5 years or so. Takes stool softeners daily, drinking lots of fluids.   Weight - still doing well on saxenda and continuing to lose weight at a healthy pace. Diet and exercise going well.   Relevant past medical, surgical, family and social history reviewed and updated as indicated. Interim medical history since our last visit reviewed. Allergies and medications reviewed and updated.  Review of Systems  Per HPI unless specifically indicated above     Objective:    BP 107/71   Pulse 83   Temp 98.3 F (36.8 C) (Oral)   Wt 146 lb (66.2 kg)   SpO2 100%   BMI 26.70 kg/m   Wt Readings from Last 3 Encounters:  11/11/19 146 lb (66.2 kg)  10/03/19 150 lb (68 kg)  09/26/19 148 lb (67.1 kg)    Physical Exam Vitals and nursing note reviewed.  Constitutional:      Appearance: Normal appearance. She is not ill-appearing.  HENT:     Head: Atraumatic.  Eyes:     Extraocular Movements: Extraocular movements intact.     Conjunctiva/sclera: Conjunctivae normal.  Cardiovascular:     Rate and Rhythm: Normal rate and regular rhythm.     Heart sounds: Normal heart sounds.  Pulmonary:     Effort: Pulmonary effort is normal.     Breath sounds: Normal breath sounds.  Abdominal:     General: Bowel sounds are normal. There is no distension.   Palpations: Abdomen is soft.     Tenderness: There is no abdominal tenderness. There is no right CVA tenderness, left CVA tenderness or guarding.  Musculoskeletal:        General: Normal range of motion.     Cervical back: Normal range of motion and neck supple.  Skin:    General: Skin is warm and dry.  Neurological:     Mental Status: She is alert and oriented to person, place, and time.  Psychiatric:        Mood and Affect: Mood normal.        Thought Content: Thought content normal.        Judgment: Judgment normal.    Results for orders placed or performed in visit on 04/27/18  Cytology - PAP  Result Value Ref Range   Adequacy      Satisfactory for evaluation  endocervical/transformation zone component PRESENT.   Diagnosis      NEGATIVE FOR INTRAEPITHELIAL LESIONS OR MALIGNANCY.   Material Submitted CervicoVaginal Pap [ThinPrep Imaged]       Assessment & Plan:   Problem List Items Addressed This Visit      Cardiovascular and Mediastinum   Migraine - Primary  Stable, seems to be seasonal. Continue imitrex prn on bad seasons        Other   Overweight (BMI 25.0-29.9)    Continues to do well on saxenda, continue current regimen and good lifestyle habits       Other Visit Diagnoses    Constipation, unspecified constipation type       Await GI evaluation in 2 weeks. No significant worsening on saxenda so continue for now. Continue bowel regimen       Follow up plan: Return in about 6 months (around 05/12/2020) for weight check.

## 2019-11-11 NOTE — Assessment & Plan Note (Signed)
Stable, seems to be seasonal. Continue imitrex prn on bad seasons

## 2019-11-22 ENCOUNTER — Other Ambulatory Visit: Payer: Self-pay

## 2019-11-22 ENCOUNTER — Ambulatory Visit (INDEPENDENT_AMBULATORY_CARE_PROVIDER_SITE_OTHER): Payer: Commercial Managed Care - PPO | Admitting: Gastroenterology

## 2019-11-22 VITALS — BP 125/82 | HR 79 | Temp 98.4°F | Ht 62.0 in | Wt 148.6 lb

## 2019-11-22 DIAGNOSIS — R140 Abdominal distension (gaseous): Secondary | ICD-10-CM

## 2019-11-22 DIAGNOSIS — K5909 Other constipation: Secondary | ICD-10-CM

## 2019-11-22 NOTE — Patient Instructions (Signed)

## 2019-11-22 NOTE — Progress Notes (Signed)
Jonathon Bellows MD, MRCP(U.K) [address]  [place]  [place], [place] [address]  Main: [phone]  Fax: [phone]   Gastroenterology Consultation  Referring Provider:     Volney American,* Primary Care Physician:  Volney American, [place] Primary Gastroenterologist:  Dr. Jonathon Bellows  Reason for Consultation:     Hemorrhoids         HPI:   Sabrina Kennedy is a 39 y.o. y/o female referred for consultation & management  By  Volney American, PA-C She has been referred for Hemorrhoids.  She states that the main reason to see me today for constipation.  She has suffered from constipation for over 5 years.  Had a bowel movement once a week.  Usually very hard.  Not much dietary fiber.  Has tried fiber supplements in the past which has not worked.  Has taken MiraLAX in the past which works but has stopped taking it as she is concerned about taking it on a daily and regular basis and wants to find out the etiology of the same.  She has a family history of hypothyroidism.  Never had a colonoscopy.  No rectal bleeding.  Complains of a lot of gas bloating and abdominal distention when she does not have a bowel movement.  Past Medical History:  Diagnosis Date  . Allergy   . Migraine     Past Surgical History:  Procedure Laterality Date  . NASAL SINUS SURGERY    . tonsils surgery      Prior to Admission medications   Medication Sig Start Date End Date Taking? Authorizing Provider  cetirizine (ZYRTEC) 10 MG tablet Take 1 tablet (10 mg total) by mouth daily. 04/22/18   Volney American, PA-C  clindamycin (CLEOCIN T) 1 % lotion APPLY TO FACE DAILY IN THE MORNING 03/08/18   [provider]  fluticasone (FLONASE) 50 MCG/ACT nasal spray Place 2 sprays into both nostrils 2 (two) times daily. 07/29/19   Volney American, PA-C  Insulin Pen Needle (PEN NEEDLES) 32G X 4 MM MISC 1 Units by Does not apply route daily. Patient not taking: Reported on 10/03/2019  04/01/19   Volney American, PA-C  Liraglutide -Weight Management (SAXENDA) 18 MG/3ML SOPN Inject 3 mg into the skin daily. 04/29/19   Volney American, PA-C  naproxen sodium (ALEVE) 220 MG tablet Take 220 mg by mouth as needed.    [provider]  SUMAtriptan (IMITREX) 100 MG tablet Take one at onset of migraine. May repeat in 2 hours if headache persists or recurs. Patient not taking: Reported on 10/03/2019 07/29/19   Volney American, PA-C    Family History  Problem Relation Age of Onset  . Diabetes Mother   . Hypertension Mother   . Asthma Mother   . Cancer Mother   . Thyroid disease Mother   . Diabetes Paternal Grandfather      Social History   Tobacco Use  . Smoking status: Never Smoker  . Smokeless tobacco: Never Used  Substance Use Topics  . Alcohol use: Not Currently  . Drug use: Never    Allergies as of 11/22/2019 - Review Complete 11/11/2019  Allergen Reaction Noted  . Prochlorperazine edisylate  04/26/2014  . Topiramate Other (See Comments) 04/26/2014    Review of Systems:    All systems reviewed and negative except where noted in HPI.   Physical Exam:  There were no vitals taken for this visit. No LMP recorded. Patient has had an  ablation. Psych:  Alert and cooperative. Normal mood and affect. General:   Alert,  Well-developed, well-nourished, pleasant and cooperative in NAD Head:  Normocephalic and atraumatic. Eyes:  Sclera clear, no icterus.   Conjunctiva pink. Lungs:  Respirations even and unlabored.  Clear throughout to auscultation.   No wheezes, crackles, or rhonchi. No acute distress. Heart:  Regular rate and rhythm; no murmurs, clicks, rubs, or gallops. Abdomen:  Normal bowel sounds.  No bruits.  Soft, non-tender and non-distended without masses, hepatosplenomegaly or hernias noted.  No guarding or rebound tenderness.    Neurologic:  Alert and oriented x3;  grossly normal neurologically. Psych:  Alert and cooperative. Normal  mood and affect.  Imaging Studies: US BREAST LTD UNI LEFT INC AXILLA  Addendum Date: 10/24/2019   ADDENDUM REPORT: 10/24/2019 14:51 ADDENDUM: This is an addendum to diagnostic mammogram performed on 10/24/2019. BI-RADS category 1: Negative. Electronically Signed   By: Baird Lyons M.D.   On: 10/24/2019 14:51   Result Date: 10/24/2019 CLINICAL DATA:  Patient was called back from baseline screening mammogram for a possible mass in the left breast. EXAM: DIGITAL DIAGNOSTIC LEFT MAMMOGRAM WITH CAD AND TOMO ULTRASOUND LEFT BREAST COMPARISON:  Baseline mammogram dated 10/18/2019. ACR Breast Density Category c: The breast tissue is heterogeneously dense, which may obscure small masses. FINDINGS: Additional imaging of the left breast was performed. There is an indeterminate lymph node in the left axilla. No suspicious mass or malignant type microcalcifications identified in the breast. Mammographic images were processed with CAD. On physical exam, I do not palpate a mass in the left axilla. Targeted ultrasound is performed, showing normal lymph nodes in the left axilla. No enlarged axillary adenopathy detected. IMPRESSION: No evidence of malignancy in the left breast. RECOMMENDATION: Bilateral screening mammogram in March of 2022 is recommended. I have discussed the findings and recommendations with the patient. If applicable, a reminder letter will be sent to the patient regarding the next appointment. BI-RADS CATEGORY  3: Probably benign. Electronically Signed: By: Baird Lyons M.D. On: 10/24/2019 11:16   MM DIAG BREAST TOMO UNI LEFT  Addendum Date: 10/24/2019   ADDENDUM REPORT: 10/24/2019 14:51 ADDENDUM: This is an addendum to diagnostic mammogram performed on 10/24/2019. BI-RADS category 1: Negative. Electronically Signed   By: Baird Lyons M.D.   On: 10/24/2019 14:51   Result Date: 10/24/2019 CLINICAL DATA:  Patient was called back from baseline screening mammogram for a possible mass in the left breast. EXAM:  DIGITAL DIAGNOSTIC LEFT MAMMOGRAM WITH CAD AND TOMO ULTRASOUND LEFT BREAST COMPARISON:  Baseline mammogram dated 10/18/2019. ACR Breast Density Category c: The breast tissue is heterogeneously dense, which may obscure small masses. FINDINGS: Additional imaging of the left breast was performed. There is an indeterminate lymph node in the left axilla. No suspicious mass or malignant type microcalcifications identified in the breast. Mammographic images were processed with CAD. On physical exam, I do not palpate a mass in the left axilla. Targeted ultrasound is performed, showing normal lymph nodes in the left axilla. No enlarged axillary adenopathy detected. IMPRESSION: No evidence of malignancy in the left breast. RECOMMENDATION: Bilateral screening mammogram in March of 2022 is recommended. I have discussed the findings and recommendations with the patient. If applicable, a reminder letter will be sent to the patient regarding the next appointment. BI-RADS CATEGORY  3: Probably benign. Electronically Signed: By: Baird Lyons M.D. On: 10/24/2019 11:16    Assessment and Plan:   Sabrina Kennedy is a 39 y.o. y/o female has  been referred for issues with Hemorrhoids.  She states her main issue is constipation.  Gradually getting worse over 5 years.  No rectal bleeding.  A lot of bloating.  Very likely slow transit with methane production attributing to her symptoms.  Low dietary fiber.  Plan 1.  Suggest increase dietary fiber with a target of 25 g/day.  Patient information provided. 2.  Reassured her that the MiraLAX is safe to take long-term.  Suggested 1 to 2 capsules a day. 3.  Check TSH. 4.  Proceed with colonoscopy to rule out any strictures. 5.  I expect her bloating to resolve once her constipation gets better.  I have discussed alternative options, risks & benefits,  which include, but are not limited to, bleeding, infection, perforation,respiratory complication & drug reaction.  The patient agrees  with this plan & written consent will be obtained.     Follow up in 6 weeks  Dr Wyline Mood MD,MRCP(U.K)

## 2019-11-22 NOTE — Addendum Note (Signed)
Addended by: Daisy Blossom on: 11/22/2019 02:40 PM   Modules accepted: Orders

## 2019-11-23 LAB — TSH: TSH: 0.812 u[IU]/mL (ref 0.450–4.500)

## 2019-11-24 ENCOUNTER — Encounter: Payer: Self-pay | Admitting: Gastroenterology

## 2019-11-28 ENCOUNTER — Other Ambulatory Visit
Admission: RE | Admit: 2019-11-28 | Discharge: 2019-11-28 | Disposition: A | Discharge status: Home / Self Care | Payer: Commercial Managed Care - PPO | Source: Ambulatory Visit | Attending: Gastroenterology | Admitting: Gastroenterology

## 2019-11-28 ENCOUNTER — Telehealth: Payer: Self-pay | Admitting: Gastroenterology

## 2019-11-28 DIAGNOSIS — Z01812 Encounter for preprocedural laboratory examination: Secondary | ICD-10-CM | POA: Insufficient documentation

## 2019-11-28 DIAGNOSIS — Z20822 Contact with and (suspected) exposure to covid-19: Secondary | ICD-10-CM | POA: Diagnosis not present

## 2019-11-28 LAB — SARS CORONAVIRUS 2 (TAT 6-24 HRS): SARS Coronavirus 2: NEGATIVE

## 2019-11-28 NOTE — Telephone Encounter (Signed)
Patient states Suprep is not covered by ins and would like something else called into [facility] in [place].

## 2019-11-28 NOTE — Telephone Encounter (Signed)
Spoke with pt and informed her that we have called in Gavilyte (Golytely) too her preferred pharmacy. I explained the directions of how to take. Pt understands and agrees.

## 2019-11-30 ENCOUNTER — Ambulatory Visit: Payer: Commercial Managed Care - PPO | Admitting: Anesthesiology

## 2019-11-30 ENCOUNTER — Encounter: Payer: Self-pay | Admitting: Gastroenterology

## 2019-11-30 ENCOUNTER — Ambulatory Visit
Admission: RE | Admit: 2019-11-30 | Discharge: 2019-11-30 | Disposition: A | Discharge status: Home / Self Care | Payer: Commercial Managed Care - PPO | Attending: Gastroenterology | Admitting: Gastroenterology

## 2019-11-30 ENCOUNTER — Other Ambulatory Visit: Payer: Self-pay

## 2019-11-30 ENCOUNTER — Encounter
Admission: RE | Disposition: A | Discharge status: Home / Self Care | Payer: Self-pay | Source: Home / Self Care | Attending: Gastroenterology

## 2019-11-30 DIAGNOSIS — Z794 Long term (current) use of insulin: Secondary | ICD-10-CM | POA: Diagnosis not present

## 2019-11-30 DIAGNOSIS — G43909 Migraine, unspecified, not intractable, without status migrainosus: Secondary | ICD-10-CM | POA: Insufficient documentation

## 2019-11-30 DIAGNOSIS — K5900 Constipation, unspecified: Secondary | ICD-10-CM | POA: Insufficient documentation

## 2019-11-30 DIAGNOSIS — R140 Abdominal distension (gaseous): Secondary | ICD-10-CM

## 2019-11-30 DIAGNOSIS — K5909 Other constipation: Secondary | ICD-10-CM

## 2019-11-30 DIAGNOSIS — Z79899 Other long term (current) drug therapy: Secondary | ICD-10-CM | POA: Diagnosis not present

## 2019-11-30 HISTORY — PX: COLONOSCOPY WITH PROPOFOL: SHX5780

## 2019-11-30 LAB — POCT PREGNANCY, URINE: Preg Test, Ur: NEGATIVE

## 2019-11-30 SURGERY — COLONOSCOPY WITH PROPOFOL
Anesthesia: General

## 2019-11-30 MED ORDER — PROPOFOL 500 MG/50ML IV EMUL
INTRAVENOUS | Status: AC
  Filled 2019-11-30: qty 50

## 2019-11-30 MED ORDER — LIDOCAINE HCL (CARDIAC) PF 100 MG/5ML IV SOSY
PREFILLED_SYRINGE | INTRAVENOUS | Status: DC | PRN
  Administered 2019-11-30: 50 mg via INTRAVENOUS

## 2019-11-30 MED ORDER — PROPOFOL 500 MG/50ML IV EMUL
INTRAVENOUS | Status: DC | PRN
  Administered 2019-11-30: 150 ug/kg/min via INTRAVENOUS

## 2019-11-30 MED ORDER — SODIUM CHLORIDE 0.9 % IV SOLN
INTRAVENOUS | Status: DC
  Administered 2019-11-30: 1000 mL via INTRAVENOUS

## 2019-11-30 MED ORDER — PROPOFOL 10 MG/ML IV BOLUS
INTRAVENOUS | Status: DC | PRN
  Administered 2019-11-30: 50 mg via INTRAVENOUS

## 2019-11-30 NOTE — Anesthesia Procedure Notes (Signed)
Date/Time: 11/30/2019 2:12 PM Performed by: Ginger Carne, CRNA Pre-anesthesia Checklist: Patient identified, Emergency Drugs available, Suction available, Patient being monitored and Timeout performed Patient Re-evaluated:Patient Re-evaluated prior to induction Oxygen Delivery Method: Nasal cannula Preoxygenation: Pre-oxygenation with 100% oxygen Induction Type: IV induction

## 2019-11-30 NOTE — Transfer of Care (Signed)
Immediate Anesthesia Transfer of Care Note  Patient: Sabrina Kennedy  Procedure(s) Performed: COLONOSCOPY WITH PROPOFOL (N/A )  Patient Location: PACU  Anesthesia Type:General  Level of Consciousness: awake and alert   Airway & Oxygen Therapy: Patient Spontanous Breathing  Post-op Assessment: Report given to RN and Post -op Vital signs reviewed and stable  Post vital signs: Reviewed and stable  Last Vitals:  Vitals Value Taken Time  BP 102/75 11/30/19 1437  Temp 36.3 C 11/30/19 1435  Pulse 76 11/30/19 1437  Resp 18 11/30/19 1437  SpO2 97 % 11/30/19 1437    Last Pain:  Vitals:   11/30/19 1435  TempSrc: Temporal  PainSc: Asleep         Complications: No apparent anesthesia complications

## 2019-11-30 NOTE — H&P (Signed)
Jonathon Bellows, MD [address], [place], [place], [place], [address] [address], [place], [place], [place], [address] Phone: [phone]  Fax: [phone]  Primary Care Physician:  Volney American, PA-C   Pre-Procedure History & Physical: HPI:  Yadhira Mckneely is a 39 y.o. female is here for an colonoscopy.   Past Medical History:  Diagnosis Date  . Allergy   . Migraine     Past Surgical History:  Procedure Laterality Date  . NASAL SINUS SURGERY    . tonsils surgery      Prior to Admission medications   Medication Sig Start Date End Date Taking? Authorizing Provider  cetirizine (ZYRTEC) 10 MG tablet Take 1 tablet (10 mg total) by mouth daily. 04/22/18  Yes Volney American, PA-C  clindamycin (CLEOCIN T) 1 % lotion APPLY TO FACE DAILY IN THE MORNING 03/08/18  Yes [provider]  fluticasone (FLONASE) 50 MCG/ACT nasal spray Place 2 sprays into both nostrils 2 (two) times daily. 07/29/19  Yes Volney American, PA-C  Insulin Pen Needle (PEN NEEDLES) 32G X 4 MM MISC 1 Units by Does not apply route daily. 04/01/19  Yes Volney American, PA-C  Liraglutide -Weight Management (SAXENDA) 18 MG/3ML SOPN Inject 3 mg into the skin daily. 04/29/19  Yes Volney American, PA-C  naproxen sodium (ALEVE) 220 MG tablet Take 220 mg by mouth as needed.   Yes [provider]  SUMAtriptan (IMITREX) 100 MG tablet Take one at onset of migraine. May repeat in 2 hours if headache persists or recurs. 07/29/19  Yes Volney American, PA-C    Allergies as of 11/23/2019 - Review Complete 11/22/2019  Allergen Reaction Noted  . Prochlorperazine edisylate  04/26/2014  . Topiramate Other (See Comments) 04/26/2014    Family History  Problem Relation Age of Onset  . Diabetes Mother   . Hypertension Mother   . Asthma Mother   . Cancer Mother   . Thyroid disease Mother   . Diabetes Paternal Grandfather     Social History   Socioeconomic History   . Marital status: Married    Spouse name: Not on file  . Number of children: Not on file  . Years of education: Not on file  . Highest education level: Not on file  Occupational History  . Not on file  Tobacco Use  . Smoking status: Never Smoker  . Smokeless tobacco: Never Used  Substance and Sexual Activity  . Alcohol use: Not Currently  . Drug use: Never  . Sexual activity: Yes  Other Topics Concern  . Not on file  Social History Narrative  . Not on file   Social Determinants of Health   Financial Resource Strain:   . Difficulty of Paying Living Expenses:   Food Insecurity:   . Worried About Charity fundraiser in the Last Year:   . Arboriculturist in the Last Year:   Transportation Needs:   . Film/video editor (Medical):   Marland Kitchen Lack of Transportation (Non-Medical):   Physical Activity:   . Days of Exercise per Week:   . Minutes of Exercise per Session:   Stress:   . Feeling of Stress :   Social Connections:   . Frequency of Communication with Friends and Family:   . Frequency of Social Gatherings with Friends and Family:   . Attends Religious Services:   . Active Member of Clubs or Organizations:   . Attends Archivist Meetings:   .  Marital Status:   Intimate Partner Violence:   . Fear of Current or Ex-Partner:   . Emotionally Abused:   Marland Kitchen Physically Abused:   . Sexually Abused:     Review of Systems: See HPI, otherwise negative ROS  Physical Exam: BP 124/88   Pulse 80   Temp 97.6 F (36.4 C) (Temporal)   Resp 17   Ht 5\' 2"  (1.575 m)   Wt 65.8 kg   SpO2 100%   BMI 26.52 kg/m  General:   Alert,  pleasant and cooperative in NAD Head:  Normocephalic and atraumatic. Neck:  Supple; no masses or thyromegaly. Lungs:  Clear throughout to auscultation, normal respiratory effort.    Heart:  +S1, +S2, Regular rate and rhythm, No edema. Abdomen:  Soft, nontender and nondistended. Normal bowel sounds, without guarding, and without rebound.    Neurologic:  Alert and  oriented x4;  grossly normal neurologically.  Impression/Plan: is here for an colonoscopy to be performed for constipation. Risks, benefits, limitations, and alternatives regarding  colonoscopy have been reviewed with the patient.  Questions have been answered.  All parties agreeable.   [name], MD  11/30/2019, 2:03 PM

## 2019-11-30 NOTE — Op Note (Addendum)
[facility] Gastroenterology Patient Name: Sabrina Kennedy Procedure Date: 11/30/2019 2:13 PM MRN: [id number] Account #: [id number] Date of Birth: 02/01/1981 Admit Type: Outpatient Age: 39 Room: [facility] ENDO ROOM 1 Gender: Female Note Status: Finalized Procedure:             Colonoscopy Indications:           Constipation Providers:             Wyline Mood MD, MD Medicines:             Monitored Anesthesia Care Complications:         No immediate complications. Procedure:             Pre-Anesthesia Assessment:                        - Prior to the procedure, a History and Physical was                         performed, and patient medications, allergies and                         sensitivities were reviewed. The patient's tolerance                         of previous anesthesia was reviewed.                        - The risks and benefits of the procedure and the                         sedation options and risks were discussed with the                         patient. All questions were answered and informed                         consent was obtained.                        - ASA Grade Assessment: II - A patient with mild                         systemic disease.                        After obtaining informed consent, the colonoscope was                         passed under direct vision. Throughout the procedure,                         the patient's blood pressure, pulse, and oxygen                         saturations were monitored continuously. The                         Colonoscope was introduced through the anus and  advanced to the the cecum, identified by the                         appendiceal orifice. The colonoscopy was performed                         with ease. The patient tolerated the procedure well.                         The quality of the bowel preparation was good. Findings:      The perianal and digital rectal  examinations were normal.      The entire examined colon appeared normal on direct and retroflexion       views. Impression:            - The entire examined colon is normal on direct and                         retroflexion views.                        - No specimens collected. Recommendation:        - Discharge patient to home (with escort).                        - Resume previous diet.                        - Continue present medications. Procedure Code(s):     --- Professional ---                        [phone], Colonoscopy, flexible; diagnostic, including                         collection of specimen(s) by brushing or washing, when                         performed (separate procedure) Diagnosis Code(s):     --- Professional ---                        K59.00, Constipation, unspecified CPT copyright 2019 American Medical Association. All rights reserved. The codes documented in this report are preliminary and upon coder review may  be revised to meet current compliance requirements. Jonathon Bellows, MD Jonathon Bellows MD, MD 11/30/2019 2:33:07 PM This report has been signed electronically. Number of Addenda: 0 Note Initiated On: 11/30/2019 2:13 PM Scope Withdrawal Time: 0 hours 8 minutes 33 seconds  Total Procedure Duration: 0 hours 13 minutes 21 seconds  Estimated Blood Loss:  Estimated blood loss: none.      [facility]

## 2019-11-30 NOTE — Anesthesia Preprocedure Evaluation (Signed)
Anesthesia Evaluation  Patient identified by MRN, date of birth, ID band Patient awake    Reviewed: Allergy & Precautions, H&P , NPO status , Patient's Chart, lab work & pertinent test results  History of Anesthesia Complications Negative for: history of anesthetic complications  Airway Mallampati: II  TM Distance: >3 FB Neck ROM: full    Dental  (+) Chipped   Pulmonary neg pulmonary ROS, neg shortness of breath,    Pulmonary exam normal        Cardiovascular Exercise Tolerance: Good (-) angina(-) Past MI and (-) DOE negative cardio ROS Normal cardiovascular exam     Neuro/Psych  Headaches, negative psych ROS   GI/Hepatic negative GI ROS, Neg liver ROS, neg GERD  ,  Endo/Other  negative endocrine ROS  Renal/GU negative Renal ROS  negative genitourinary   Musculoskeletal   Abdominal   Peds  Hematology negative hematology ROS (+)   Anesthesia Other Findings Past Medical History: No date: Allergy No date: Migraine  Past Surgical History: No date: NASAL SINUS SURGERY No date: tonsils surgery  BMI    Body Mass Index: 26.52 kg/m      Reproductive/Obstetrics negative OB ROS                             Anesthesia Physical Anesthesia Plan  ASA: II  Anesthesia Plan: General   Post-op Pain Management:    Induction: Intravenous  PONV Risk Score and Plan: Propofol infusion and TIVA  Airway Management Planned: Natural Airway and Nasal Cannula  Additional Equipment:   Intra-op Plan:   Post-operative Plan:   Informed Consent: I have reviewed the patients History and Physical, chart, labs and discussed the procedure including the risks, benefits and alternatives for the proposed anesthesia with the patient or authorized representative who has indicated his/her understanding and acceptance.     Dental Advisory Given  Plan Discussed with: Anesthesiologist, CRNA and  Surgeon  Anesthesia Plan Comments: (Patient consented for risks of anesthesia including but not limited to:  - adverse reactions to medications - risk of intubation if required - damage to eyes, teeth, lips or other oral mucosa - nerve damage due to positioning  - sore throat or hoarseness - Damage to heart, brain, nerves, lungs or loss of life  Patient voiced understanding.)        Anesthesia Quick Evaluation

## 2019-12-01 ENCOUNTER — Encounter: Payer: Self-pay | Admitting: *Deleted

## 2019-12-01 NOTE — Anesthesia Postprocedure Evaluation (Signed)
Anesthesia Post Note  Patient: Proofreader  Procedure(s) Performed: COLONOSCOPY WITH PROPOFOL (N/A )  Patient location during evaluation: Endoscopy Anesthesia Type: General Level of consciousness: awake and alert Pain management: pain level controlled Vital Signs Assessment: post-procedure vital signs reviewed and stable Respiratory status: spontaneous breathing, nonlabored ventilation, respiratory function stable and patient connected to nasal cannula oxygen Cardiovascular status: blood pressure returned to baseline and stable Postop Assessment: no apparent nausea or vomiting Anesthetic complications: no     Last Vitals:  Vitals:   11/30/19 1445 11/30/19 1455  BP: (!) 123/95 114/83  Pulse:    Resp: 17   Temp:    SpO2:  97%    Last Pain:  Vitals:   11/30/19 1455  TempSrc:   PainSc: 0-No pain                 Cleda Mccreedy Encarnacion Scioneaux

## 2020-04-24 ENCOUNTER — Other Ambulatory Visit: Payer: Self-pay | Admitting: Family Medicine

## 2020-04-24 NOTE — Telephone Encounter (Signed)
Requested Prescriptions  Pending Prescriptions Disp Refills   SAXENDA 18 MG/3ML SOPN [Pharmacy Med Name: SAXENDA 18 MG/3 ML PEN] 15 mL 0    Sig: INJECT 3 MG INTO THE SKIN DAILY.     Endocrinology:  Diabetes - GLP-1 Receptor Agonists Failed - 04/24/2020  1:18 AM      Failed - HBA1C is between 0 and 7.9 and within 180 days    No results found for: HGBA1C, LABA1C       Passed - Valid encounter within last 6 months    Recent Outpatient Visits          5 months ago Migraine without status migrainosus, not intractable, unspecified migraine type   [facility], [place], [place]   7 months ago Hemorrhoids, unspecified hemorrhoid type   [facility], [place], [place]   9 months ago Migraine without status migrainosus, not intractable, unspecified migraine type   [facility], [place], [place]   12 months ago Class 1 obesity due to excess calories without serious comorbidity with body mass index (BMI) of 31.0 to 31.9 in adult   [facility], [place], [place]   1 year ago Class 1 obesity due to excess calories without serious comorbidity with body mass index (BMI) of 31.0 to 31.9 in adult   [facility], [place], [place]      Future Appointments            In 3 weeks Valentino Nose, NP [facility], [facility]

## 2020-05-18 ENCOUNTER — Ambulatory Visit: Payer: Commercial Managed Care - PPO | Admitting: Family Medicine

## 2020-05-18 ENCOUNTER — Ambulatory Visit: Payer: Commercial Managed Care - PPO | Admitting: Nurse Practitioner

## 2020-06-21 ENCOUNTER — Other Ambulatory Visit: Payer: Self-pay | Admitting: Family Medicine

## 2020-06-21 NOTE — Telephone Encounter (Signed)
Requested medication (s) are due for refill today:  yes   Requested medication (s) are on the active medication list: yes  Future visit scheduled: no  Notes to clinic: Patient last seen by Roosvelt Maser Has not seen another provider Review for fill   Requested Prescriptions  Pending Prescriptions Disp Refills   SUMAtriptan (IMITREX) 100 MG tablet [Pharmacy Med Name: SUMATRIPTAN SUCC 100 MG TABLET] 180 tablet 1    Sig: Take one at onset of migraine. May repeat in 2 hours if headache persists or recurs.      Neurology:  Migraine Therapy - Triptan Passed - 06/21/2020  6:52 AM      Passed - Last BP in normal range    BP Readings from Last 1 Encounters:  11/30/19 114/83          Passed - Valid encounter within last 12 months    Recent Outpatient Visits           7 months ago Migraine without status migrainosus, not intractable, unspecified migraine type   [facility], [place], [place]   8 months ago Hemorrhoids, unspecified hemorrhoid type   [facility], [place], [place]   10 months ago Migraine without status migrainosus, not intractable, unspecified migraine type   [facility], [place], [place]   1 year ago Class 1 obesity due to excess calories without serious comorbidity with body mass index (BMI) of 31.0 to 31.9 in adult   [facility], [place], [place]   1 year ago Class 1 obesity due to excess calories without serious comorbidity with body mass index (BMI) of 31.0 to 31.9 in adult   [facility], [place], [place]

## 2020-08-07 ENCOUNTER — Telehealth: Payer: Self-pay

## 2020-08-07 NOTE — Telephone Encounter (Signed)
PA for Saxenda initiated and submitted via Cover My Meds. Key: [id number]

## 2020-10-05 ENCOUNTER — Ambulatory Visit: Payer: Commercial Managed Care - PPO | Admitting: Obstetrics & Gynecology

## 2020-10-09 ENCOUNTER — Other Ambulatory Visit: Payer: Self-pay

## 2020-10-09 ENCOUNTER — Ambulatory Visit (INDEPENDENT_AMBULATORY_CARE_PROVIDER_SITE_OTHER): Payer: BC Managed Care – PPO | Admitting: Obstetrics & Gynecology

## 2020-10-09 ENCOUNTER — Encounter: Payer: Self-pay | Admitting: Obstetrics & Gynecology

## 2020-10-09 ENCOUNTER — Other Ambulatory Visit (HOSPITAL_COMMUNITY)
Admission: RE | Admit: 2020-10-09 | Discharge: 2020-10-09 | Disposition: A | Discharge status: Home / Self Care | Payer: BC Managed Care – PPO | Source: Ambulatory Visit | Attending: Obstetrics & Gynecology | Admitting: Obstetrics & Gynecology

## 2020-10-09 VITALS — BP 120/80 | Ht 62.0 in | Wt 161.0 lb

## 2020-10-09 DIAGNOSIS — Z01419 Encounter for gynecological examination (general) (routine) without abnormal findings: Secondary | ICD-10-CM | POA: Diagnosis not present

## 2020-10-09 DIAGNOSIS — Z1231 Encounter for screening mammogram for malignant neoplasm of breast: Secondary | ICD-10-CM | POA: Diagnosis not present

## 2020-10-09 DIAGNOSIS — Z124 Encounter for screening for malignant neoplasm of cervix: Secondary | ICD-10-CM

## 2020-10-09 NOTE — Progress Notes (Signed)
HPI:      Sabrina Kennedy is a 40 y.o. G1P1001 who LMP was No LMP recorded. Patient has had an ablation.  She presents today for her annual examination. The patient has complaints today of continued bloating and constipation regularly; she also has occas PMS and occas dyspareunia (not every time).  She has had Ablation 2 years ago and just has occas brown like d/c.  She feels as though she is ovulating and having hormonal sx's, but not severe enough to treat just yet.  The patient is sexually active. Her last pap: approximate date 2019 and was normal and last mammogram: approximate date 2021 and was normal. The patient does perform self breast exams.  There is no notable family history of breast or ovarian cancer in her family.  The patient has regular exercise: yes.  The patient denies current symptoms of depression.    GYN History: Contraception: vasectomy  PMHx: Past Medical History:  Diagnosis Date  . Allergy   . Migraine    Past Surgical History:  Procedure Laterality Date  . COLONOSCOPY WITH PROPOFOL N/A 11/30/2019   Procedure: COLONOSCOPY WITH PROPOFOL;  Surgeon: Wyline Mood, MD;  Location: [facility] ENDOSCOPY;  Service: Gastroenterology;  Laterality: N/A;  . NASAL SINUS SURGERY    . tonsils surgery     Family History  Problem Relation Age of Onset  . Diabetes Mother   . Hypertension Mother   . Asthma Mother   . Cancer Mother   . Thyroid disease Mother   . Diabetes Paternal Grandfather    Social History   Tobacco Use  . Smoking status: Never Smoker  . Smokeless tobacco: Never Used  Vaping Use  . Vaping Use: Never used  Substance Use Topics  . Alcohol use: Not Currently  . Drug use: Never    Current Outpatient Medications:  .  cetirizine (ZYRTEC) 10 MG tablet, Take 1 tablet (10 mg total) by mouth daily., Disp: 30 tablet, Rfl: 11 .  naproxen sodium (ALEVE) 220 MG tablet, Take 220 mg by mouth as needed., Disp: , Rfl:  .  SUMAtriptan (IMITREX) 100 MG tablet, TAKE ONE  AT ONSET OF MIGRAINE. MAY REPEAT IN 2 HOURS IF HEADACHE PERSISTS OR RECURS., Disp: 10 tablet, Rfl: 1 .  clindamycin (CLEOCIN T) 1 % lotion, APPLY TO FACE DAILY IN THE MORNING (Patient not taking: Reported on 10/09/2020), Disp: , Rfl: 2 .  fluticasone (FLONASE) 50 MCG/ACT nasal spray, Place 2 sprays into both nostrils 2 (two) times daily. (Patient not taking: Reported on 10/09/2020), Disp: 16 g, Rfl: 6 .  Insulin Pen Needle (PEN NEEDLES) 32G X 4 MM MISC, 1 Units by Does not apply route daily. (Patient not taking: Reported on 10/09/2020), Disp: 100 each, Rfl: 1 .  SAXENDA 18 MG/3ML SOPN, INJECT 3 MG INTO THE SKIN DAILY. (Patient not taking: Reported on 10/09/2020), Disp: 15 mL, Rfl: 0 Allergies: Prochlorperazine edisylate and Topiramate  Review of Systems  Constitutional: Negative for chills, fever and malaise/fatigue.  HENT: Negative for congestion, sinus pain and sore throat.   Eyes: Negative for blurred vision and pain.  Respiratory: Negative for cough and wheezing.   Cardiovascular: Negative for chest pain and leg swelling.  Gastrointestinal: Positive for abdominal pain and constipation. Negative for diarrhea, heartburn, nausea and vomiting.  Genitourinary: Negative for dysuria, frequency, hematuria and urgency.  Musculoskeletal: Negative for back pain, joint pain, myalgias and neck pain.  Skin: Negative for itching and rash.  Neurological: Negative for dizziness, tremors and weakness.  Endo/Heme/Allergies: Does not bruise/bleed easily.  Psychiatric/Behavioral: Negative for depression. The patient is not nervous/anxious and does not have insomnia.     Objective: BP 120/80   Ht 5\' 2"  (1.575 m)   Wt 161 lb (73 kg)   BMI 29.45 kg/m   Filed Weights   10/09/20 1336  Weight: 161 lb (73 kg)   Body mass index is 29.45 kg/m. Physical Exam Constitutional:      General: She is not in acute distress.    Appearance: She is well-developed.  Genitourinary:     Rectum normal.     No lesions in  the vagina.     No vaginal bleeding.      Right Adnexa: not tender and no mass present.    Left Adnexa: not tender and no mass present.    No cervical motion tenderness, friability, lesion or polyp.     Uterus is not enlarged.     No uterine mass detected. Breasts:     Right: No mass, skin change or tenderness.     Left: No mass, skin change or tenderness.    HENT:     Head: Normocephalic and atraumatic. No laceration.     Right Ear: Hearing normal.     Left Ear: Hearing normal.     Mouth/Throat:     Pharynx: Uvula midline.  Eyes:     Pupils: Pupils are equal, round, and reactive to light.  Neck:     Thyroid: No thyromegaly.  Cardiovascular:     Rate and Rhythm: Normal rate and regular rhythm.     Heart sounds: No murmur heard. No friction rub. No gallop.   Pulmonary:     Effort: Pulmonary effort is normal. No respiratory distress.     Breath sounds: Normal breath sounds. No wheezing.  Abdominal:     General: Bowel sounds are normal. There is no distension.     Palpations: Abdomen is soft.     Tenderness: There is no abdominal tenderness. There is no rebound.  Musculoskeletal:        General: Normal range of motion.     Cervical back: Normal range of motion and neck supple.  Neurological:     Mental Status: She is alert and oriented to person, place, and time.     Cranial Nerves: No cranial nerve deficit.  Skin:    General: Skin is warm and dry.  Psychiatric:        Judgment: Judgment normal.  Vitals reviewed.     Assessment:  ANNUAL EXAM 1. Women's annual routine gynecological examination   2. Encounter for screening mammogram for malignant neoplasm of breast   3. Screening for cervical cancer      Screening Plan:            1.  Cervical Screening-  Pap smear done today  2. Breast screening- Exam annually and mammogram>40 planned   3. Colonoscopy every 10 years, Hemoccult testing - after age 41  4. Labs managed by PCP  5. Counseling for contraception:  vasectomy  Upstream - 10/09/20 1339      Pregnancy Intention Screening   Does the patient want to become pregnant in the next year? No    Does the patient's partner want to become pregnant in the next year? No    Would the patient like to discuss contraceptive options today? No           6. Premenstrual symptoms without periods.  S/p ablation.  Option for hormone  therapy discussed to help w sx's if become more severe or frequent.  Also option for surgery to assess ovaries, tubes and endometriosis possibility (low likelihood).  Will monitor for now    F/U  Return in about 1 year (around 10/09/2021) for Annual.  Annamarie Major, MD, [facility], [facility] 10/09/2020  1:58 PM

## 2020-10-09 NOTE — Patient Instructions (Signed)
PAP every three years Mammogram every year    Call [phone] to schedule at [facility] yearly (with PCP)  Thank you for choosing [facility]. As part of our ongoing efforts to improve patient experience, we would appreciate your feedback. Please fill out the short survey that you will receive by mail or MyChart. Your opinion is important to us! - Dr. Oluwatobi Ruppe   

## 2020-10-11 LAB — CYTOLOGY - PAP
Comment: NEGATIVE
Diagnosis: NEGATIVE
High risk HPV: NEGATIVE

## 2021-01-17 ENCOUNTER — Telehealth: Payer: Self-pay

## 2021-01-17 NOTE — Telephone Encounter (Signed)
Pt aware to schedule mamogram

## 2021-01-17 NOTE — Telephone Encounter (Signed)
-----   Message from Nadara Mustard, MD sent at 01/04/2021  8:00 AM EDT ----- Regarding: MMG Received notice she has not received MMG yet as ordered at her Annual. Please check and encourage her to do this, and document conversation.

## 2021-03-06 ENCOUNTER — Other Ambulatory Visit: Payer: Self-pay

## 2021-03-06 ENCOUNTER — Ambulatory Visit
Admission: RE | Admit: 2021-03-06 | Discharge: 2021-03-06 | Disposition: A | Discharge status: Home / Self Care | Payer: BC Managed Care – PPO | Source: Ambulatory Visit | Attending: Obstetrics & Gynecology | Admitting: Obstetrics & Gynecology

## 2021-03-06 DIAGNOSIS — Z1231 Encounter for screening mammogram for malignant neoplasm of breast: Secondary | ICD-10-CM | POA: Diagnosis not present

## 2021-03-07 ENCOUNTER — Other Ambulatory Visit: Payer: Self-pay | Admitting: Obstetrics & Gynecology

## 2021-03-07 DIAGNOSIS — N6489 Other specified disorders of breast: Secondary | ICD-10-CM

## 2021-03-07 DIAGNOSIS — R928 Other abnormal and inconclusive findings on diagnostic imaging of breast: Secondary | ICD-10-CM

## 2021-03-08 ENCOUNTER — Other Ambulatory Visit: Payer: Self-pay | Admitting: Obstetrics & Gynecology

## 2021-03-08 DIAGNOSIS — R928 Other abnormal and inconclusive findings on diagnostic imaging of breast: Secondary | ICD-10-CM

## 2021-03-12 ENCOUNTER — Telehealth: Payer: Self-pay | Admitting: *Deleted

## 2021-03-12 NOTE — Telephone Encounter (Signed)
CALLED PT TO [facility] - PT STATED DUE TO INSURANCE PURPOSES SHE WOULD LIKE TO GO TO [facility] FOR HER AV / MAMMO

## 2021-07-29 ENCOUNTER — Encounter: Payer: Self-pay | Admitting: Obstetrics & Gynecology

## 2021-09-02 ENCOUNTER — Encounter: Payer: Self-pay | Admitting: Obstetrics & Gynecology

## 2021-09-23 ENCOUNTER — Encounter: Payer: Self-pay | Admitting: Advanced Practice Midwife

## 2021-09-23 ENCOUNTER — Other Ambulatory Visit (HOSPITAL_COMMUNITY)
Admission: RE | Admit: 2021-09-23 | Discharge: 2021-09-23 | Disposition: A | Discharge status: Home / Self Care | Payer: BC Managed Care – PPO | Source: Ambulatory Visit | Attending: Advanced Practice Midwife | Admitting: Advanced Practice Midwife

## 2021-09-23 ENCOUNTER — Ambulatory Visit (INDEPENDENT_AMBULATORY_CARE_PROVIDER_SITE_OTHER): Payer: BC Managed Care – PPO | Admitting: Advanced Practice Midwife

## 2021-09-23 ENCOUNTER — Other Ambulatory Visit: Payer: Self-pay

## 2021-09-23 VITALS — BP 122/70 | Ht 62.0 in | Wt 161.0 lb

## 2021-09-23 DIAGNOSIS — Z113 Encounter for screening for infections with a predominantly sexual mode of transmission: Secondary | ICD-10-CM | POA: Insufficient documentation
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
## 2021-09-25 LAB — CERVICOVAGINAL ANCILLARY ONLY
Chlamydia: NEGATIVE
Comment: NEGATIVE
Comment: NEGATIVE
Comment: NORMAL
Neisseria Gonorrhea: NEGATIVE
Trichomonas: NEGATIVE

## 2021-10-14 IMAGING — MG MM DIGITAL SCREENING BILAT W/ TOMO AND CAD
8 series · 8 of 24 positions shown · non-contrast
Comparison: Previous exam(s).
COMPARISON: Previous exam(s).

Addendum:
CLINICAL DATA: Screening.

EXAM:
DIGITAL SCREENING BILATERAL MAMMOGRAM WITH TOMOSYNTHESIS AND CAD
TECHNIQUE: Bilateral screening digital craniocaudal and mediolateral oblique
mammograms were obtained. Bilateral screening digital breast
tomosynthesis was performed. The images were evaluated with
computer-aided detection.

[L MLO synth-2D]
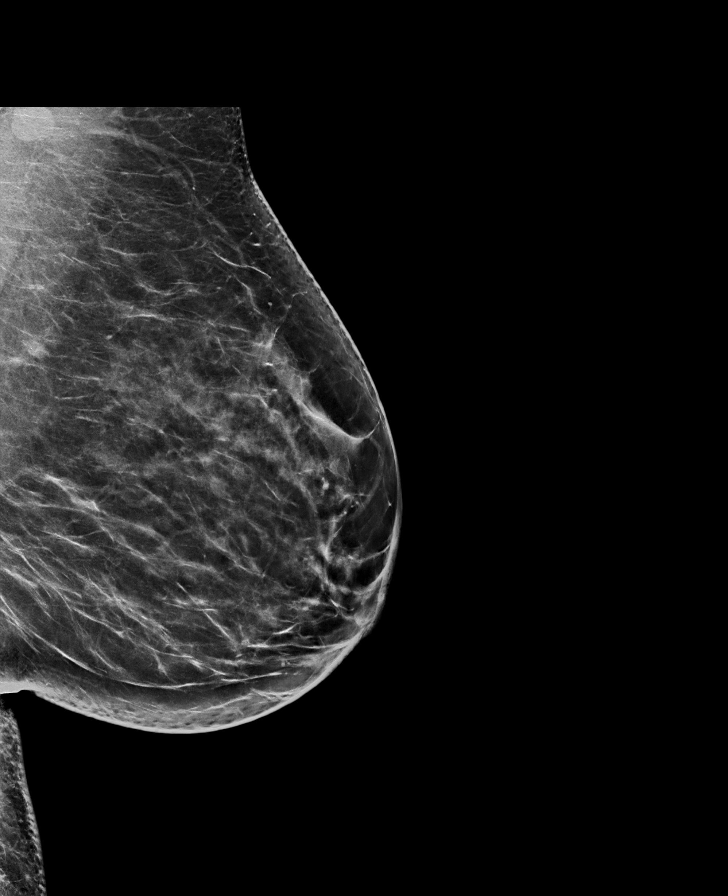

[R MLO synth-2D]
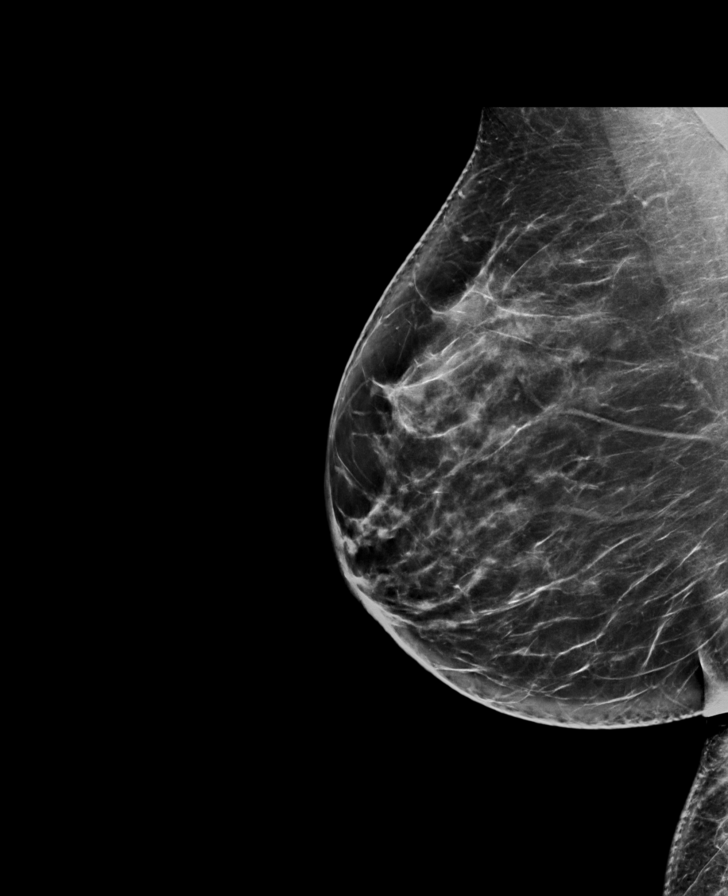

[R CC synth-2D]
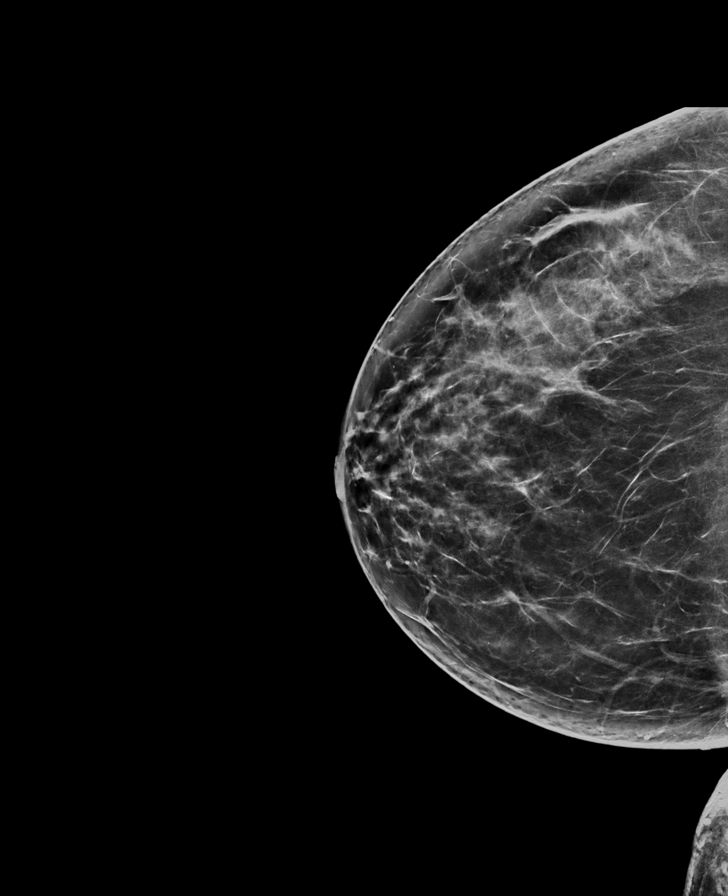

[L CC synth-2D]
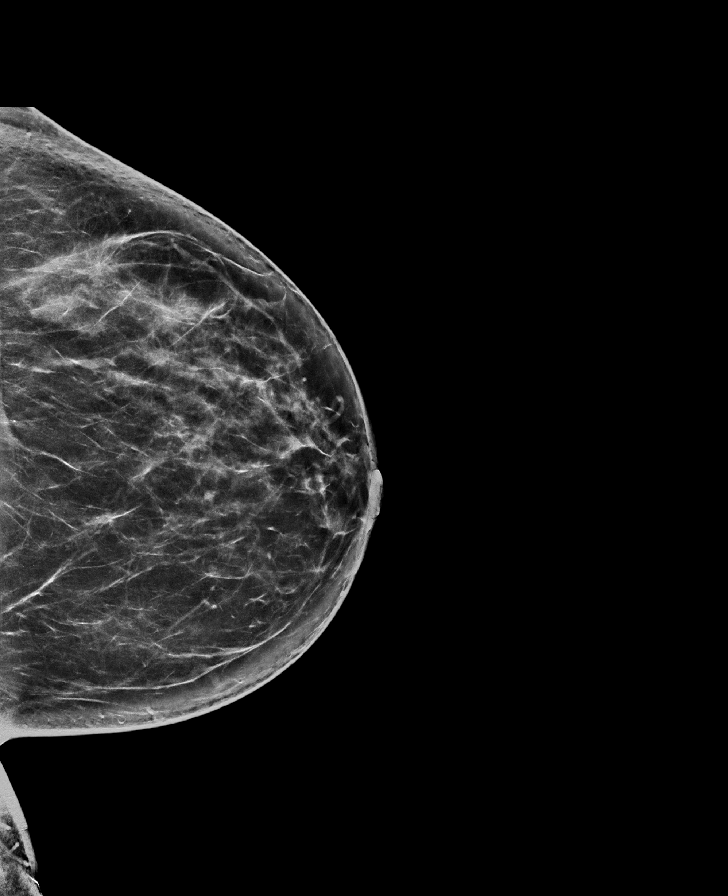

[R MLO tomo · tomo slice 42/83.0]
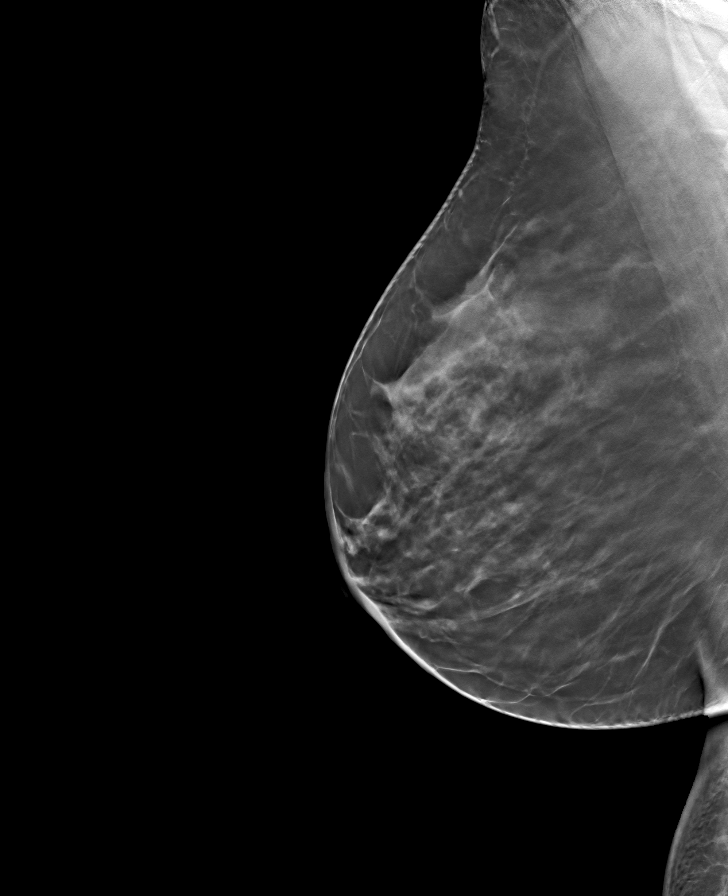

[L CC tomo · tomo slice 39/78.0]
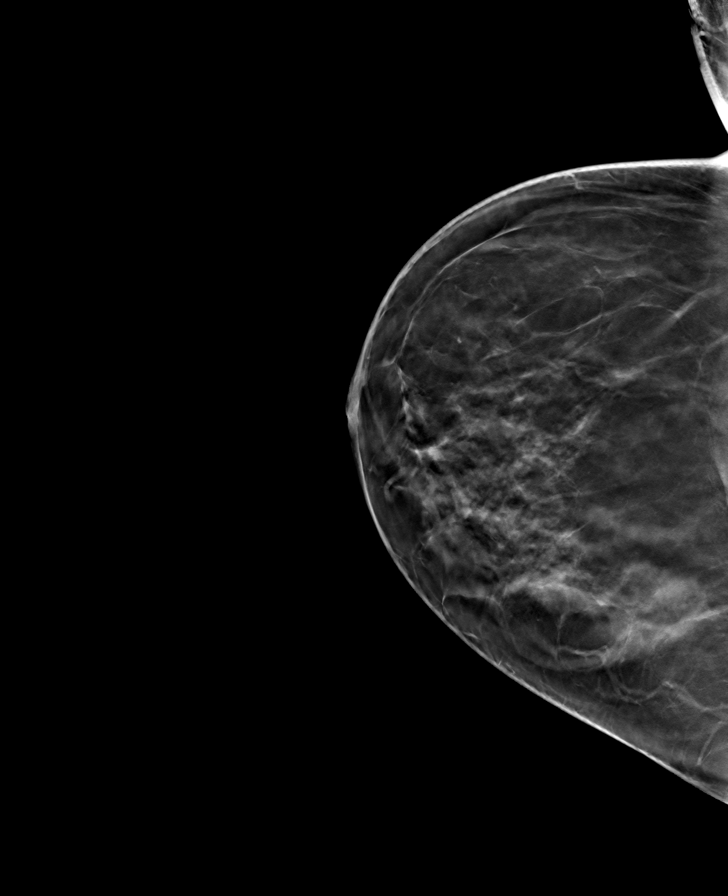

[R CC tomo · tomo slice 39/78.0]
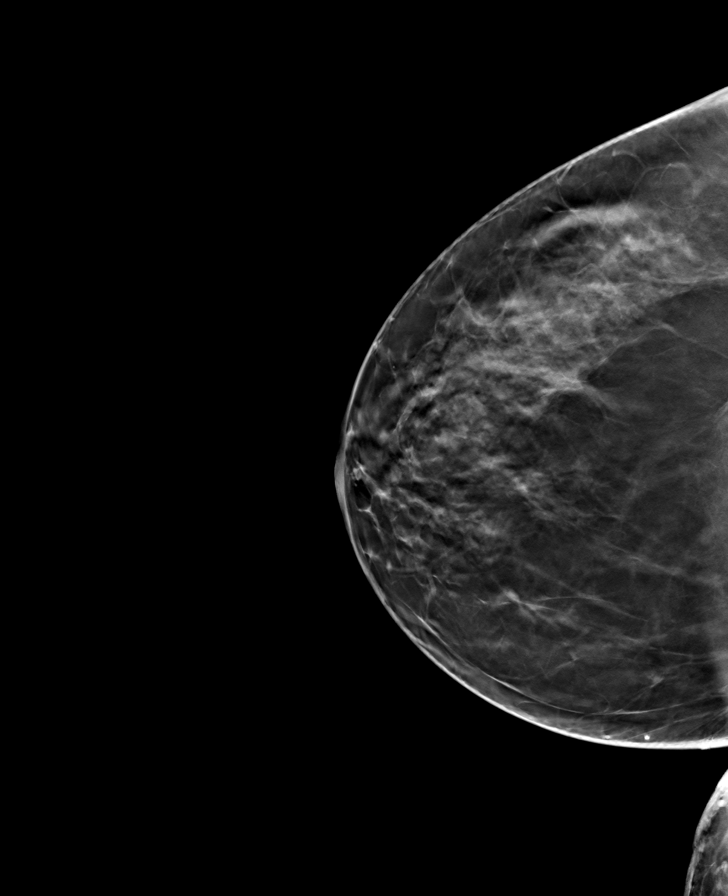

[L MLO tomo · tomo slice 41/81.0]
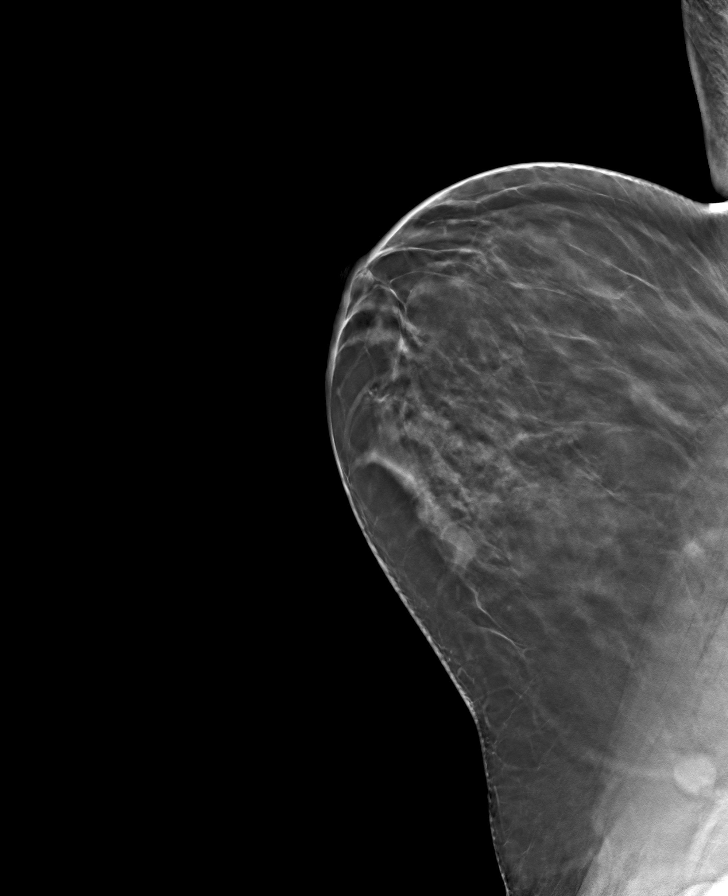

[8 of 24 positions shown; findings below may reference images not displayed]

ACR Breast Density Category c: The breast tissue is heterogeneously
dense, which may obscure small masses.
FINDINGS: In the left breast, a possible asymmetry warrants further
evaluation. In the right breast, no findings suspicious for
malignancy.
IMPRESSION: Further evaluation is suggested for possible asymmetry in the left
breast.

RECOMMENDATION:
Diagnostic mammogram and possibly ultrasound of the left breast.
(Code:[id number])

The patient will be contacted regarding the findings, and additional
imaging will be scheduled.

BI-RADS CATEGORY  0: Incomplete. Need additional imaging evaluation
and/or prior mammograms for comparison.

ADDENDUM:
The previously questioned asymmetry is located in the slightly upper
left breast at far posterior depth on the MLO projection only. It
localizes slightly laterally on tomosynthesis and is likely located
in the far posterior upper outer quadrant. Additionally, note is
made of a prominent low lying left axillary lymph node which should
also be evaluated at the time of the diagnostic mammogram.

*** End of Addendum ***
ACR Breast Density Category c: The breast tissue is heterogeneously
dense, which may obscure small masses.
FINDINGS: In the left breast, a possible asymmetry warrants further
evaluation. In the right breast, no findings suspicious for
malignancy.
IMPRESSION: Further evaluation is suggested for possible asymmetry in the left
breast.

RECOMMENDATION:
Diagnostic mammogram and possibly ultrasound of the left breast.
(Code:[id number])

The patient will be contacted regarding the findings, and additional
imaging will be scheduled.

BI-RADS CATEGORY  0: Incomplete. Need additional imaging evaluation
and/or prior mammograms for comparison.

## 2023-06-26 ENCOUNTER — Ambulatory Visit
Admission: RE | Admit: 2023-06-26 | Discharge: 2023-06-26 | Disposition: A | Discharge status: Home / Self Care | Payer: BC Managed Care – PPO | Source: Ambulatory Visit | Attending: Otolaryngology | Admitting: Otolaryngology

## 2023-06-26 ENCOUNTER — Other Ambulatory Visit: Payer: Self-pay | Admitting: Otolaryngology

## 2023-06-26 DIAGNOSIS — E069 Thyroiditis, unspecified: Secondary | ICD-10-CM

## 2023-06-30 ENCOUNTER — Other Ambulatory Visit: Payer: Self-pay | Admitting: Otolaryngology

## 2023-06-30 DIAGNOSIS — E041 Nontoxic single thyroid nodule: Secondary | ICD-10-CM
